# Patient Record
Sex: Female | Born: 1965 | Race: White | Hispanic: No | Marital: Married | Smoking: Current every day smoker
Health system: Southern US, Community
[De-identification: ages and names within clinical notes are randomized; demographics above are authoritative.]

## PROBLEM LIST (undated history)

## (undated) DIAGNOSIS — M898X9 Other specified disorders of bone, unspecified site: Secondary | ICD-10-CM

## (undated) DIAGNOSIS — M549 Dorsalgia, unspecified: Secondary | ICD-10-CM

## (undated) DIAGNOSIS — F39 Unspecified mood [affective] disorder: Secondary | ICD-10-CM

## (undated) DIAGNOSIS — G4730 Sleep apnea, unspecified: Secondary | ICD-10-CM

## (undated) DIAGNOSIS — E785 Hyperlipidemia, unspecified: Secondary | ICD-10-CM

## (undated) DIAGNOSIS — M2560 Stiffness of unspecified joint, not elsewhere classified: Secondary | ICD-10-CM

## (undated) DIAGNOSIS — R45851 Suicidal ideations: Secondary | ICD-10-CM

## (undated) DIAGNOSIS — F419 Anxiety disorder, unspecified: Secondary | ICD-10-CM

## (undated) DIAGNOSIS — F329 Major depressive disorder, single episode, unspecified: Secondary | ICD-10-CM

## (undated) DIAGNOSIS — K5900 Constipation, unspecified: Secondary | ICD-10-CM

## (undated) DIAGNOSIS — J449 Chronic obstructive pulmonary disease, unspecified: Secondary | ICD-10-CM

## (undated) DIAGNOSIS — K219 Gastro-esophageal reflux disease without esophagitis: Secondary | ICD-10-CM

## (undated) DIAGNOSIS — J45909 Unspecified asthma, uncomplicated: Secondary | ICD-10-CM

## (undated) DIAGNOSIS — G8929 Other chronic pain: Secondary | ICD-10-CM

## (undated) DIAGNOSIS — T8859XA Other complications of anesthesia, initial encounter: Secondary | ICD-10-CM

## (undated) DIAGNOSIS — T7840XA Allergy, unspecified, initial encounter: Secondary | ICD-10-CM

## (undated) DIAGNOSIS — F32A Depression, unspecified: Secondary | ICD-10-CM

## (undated) DIAGNOSIS — T4145XA Adverse effect of unspecified anesthetic, initial encounter: Secondary | ICD-10-CM

## (undated) DIAGNOSIS — M1990 Unspecified osteoarthritis, unspecified site: Secondary | ICD-10-CM

## (undated) DIAGNOSIS — F319 Bipolar disorder, unspecified: Secondary | ICD-10-CM

## (undated) DIAGNOSIS — G43909 Migraine, unspecified, not intractable, without status migrainosus: Secondary | ICD-10-CM

## (undated) HISTORY — DX: Depression, unspecified: F32.A

## (undated) HISTORY — DX: Migraine, unspecified, not intractable, without status migrainosus: G43.909

## (undated) HISTORY — DX: Major depressive disorder, single episode, unspecified: F32.9

## (undated) HISTORY — DX: Unspecified osteoarthritis, unspecified site: M19.90

## (undated) HISTORY — DX: Other specified disorders of bone, unspecified site: M89.8X9

## (undated) HISTORY — DX: Allergy, unspecified, initial encounter: T78.40XA

## (undated) HISTORY — DX: Other chronic pain: G89.29

## (undated) HISTORY — DX: Unspecified mood (affective) disorder: F39

## (undated) HISTORY — PX: TUBAL LIGATION: SHX77

## (undated) HISTORY — DX: Constipation, unspecified: K59.00

## (undated) HISTORY — PX: BREAST SURGERY: SHX581

## (undated) HISTORY — DX: Gastro-esophageal reflux disease without esophagitis: K21.9

## (undated) HISTORY — DX: Stiffness of unspecified joint, not elsewhere classified: M25.60

## (undated) HISTORY — DX: Unspecified asthma, uncomplicated: J45.909

## (undated) HISTORY — PX: BACK SURGERY: SHX140

## (undated) HISTORY — DX: Dorsalgia, unspecified: M54.9

## (undated) HISTORY — PX: OTHER SURGICAL HISTORY: SHX169

## (undated) HISTORY — DX: Bipolar disorder, unspecified: F31.9

## (undated) HISTORY — DX: Hyperlipidemia, unspecified: E78.5

## (undated) HISTORY — PX: TONSILLECTOMY: SUR1361

## (undated) HISTORY — DX: Suicidal ideations: R45.851

## (undated) HISTORY — PX: EYE SURGERY: SHX253

---
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
## 1992-05-20 HISTORY — PX: ABDOMINAL HYSTERECTOMY: SHX81

## 2002-02-08 ENCOUNTER — Encounter: Admission: RE | Admit: 2002-02-08 | Discharge: 2002-02-25 | Payer: Self-pay | Admitting: Unknown Physician Specialty

## 2002-03-17 ENCOUNTER — Encounter: Payer: Self-pay | Admitting: Neurosurgery

## 2002-03-22 ENCOUNTER — Ambulatory Visit (HOSPITAL_COMMUNITY): Admission: RE | Admit: 2002-03-22 | Discharge: 2002-03-22 | Payer: Self-pay | Admitting: Neurosurgery

## 2002-03-22 ENCOUNTER — Encounter: Payer: Self-pay | Admitting: Neurosurgery

## 2002-05-03 ENCOUNTER — Encounter: Admission: RE | Admit: 2002-05-03 | Discharge: 2002-06-08 | Payer: Self-pay | Admitting: Neurosurgery

## 2008-04-11 ENCOUNTER — Encounter: Admission: RE | Admit: 2008-04-11 | Discharge: 2008-04-11 | Payer: Self-pay | Admitting: Neurosurgery

## 2008-05-02 ENCOUNTER — Ambulatory Visit (HOSPITAL_COMMUNITY): Admission: RE | Admit: 2008-05-02 | Discharge: 2008-05-02 | Payer: Self-pay | Admitting: Neurosurgery

## 2008-08-29 ENCOUNTER — Encounter: Admission: RE | Admit: 2008-08-29 | Discharge: 2008-08-29 | Payer: Self-pay | Admitting: Neurosurgery

## 2010-10-02 NOTE — Op Note (Signed)
Susan Ward, HEEG NO.:  [id number]   MEDICAL RECORD NO.:  [id number]          PATIENT TYPE:  OIB   LOCATION:  3025                         FACILITY:  [facility]   PHYSICIAN:  Kathaleen Maser. Pool, M.D.    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   PROCEDURE NAME:  Right T10/T11 transpedicular microdiskectomy.   SURGEON:  Kathaleen Maser. Pool, MD   ASSISTANT:  Donalee Citrin, MD   ANESTHESIA:  General endotracheal.   INDICATIONS:  Susan Ward is a 45 year old female with history of back and  right lower extremity pain and dysfunction consistent with the lower  thoracic myelopathy.  Workup demonstrates evidence of rather large right  paracentral disk herniation with compression of thecal sac and spinal  cord and nerve roots.  The patient has been counseled as to her options.  She decided proceed with a right-sided T10/T11 transpedicular  microdiskectomy in hopes improving her symptoms.   OPERATIVE NOTE:  The patient was brought to the operating room and  placed on the operating table in a supine position.  After adequate  level of anesthesia was achieved, the patient was placed prone onto  Wilson frame, appropriately padded.  The patient's lumbar regions were  prepped and draped sterilely.  A 10-blade was used to make a curvilinear  incision overlying the T10/T11 interspace.  This was carried down  sharply in the midline.  Subperiosteal dissection was then performed  exposing the lamina and facet joints of T10 and T11.  Deep self-  retaining retractor was placed.  Intraoperative x-ray was taken.  Level  was confirmed.  Laminotomy and facetectomy was then performed on the  right-sided T10-11 using the  high-speed drill and Kerrison rongeurs.  Inferior aspect of lamina of T10, medial aspect of T10-11 facet joint,  and the superior rim of T11 lamina were removed.  Microscope was then  brought to the field.  Ligament flavum was then elevated and then the  underlying thecal sac was identified.  The superior aspect of the  pedicle was then drilled down flush to the level of the vertebral body  of T11.  Disk space was then identified.  It was then incised with 15  blade.  The disk space was then entered through the superior aspect of  the vertebral body of T11 to help make a pocket into which the disk  could be pushed into and resected.  Using this technique and working  from lateral to medial, diskectomy was then performed under microscopic  visualization completely removing the central disk herniation without  putting pressure upon the thecal sac.  All elements of disk herniation  were completely resected.  All loose fragments and degenerative disk  material were removed from the interspace.  At this point, a very  thorough decompression had been achieved.  There was no  injury to thecal  sac or nerve roots.  Wound was then irrigated with antibiotic solution.  Gelfoam was placed topically for hemostasis which was found to be good.  Microscope and retractors were removed.  Hemostasis was then achieved  with  electrocautery.  Wound was then closed in layers with Vicryl sutures.  Steri-Strips and sterile dressings were applied.  There were no apparent  complications.  The patient tolerated the procedure well and she returns  to recovery room postoperatively.           ______________________________  Kathaleen Maser Pool, M.D.     HAP/MEDQ  D:  05/02/2008  T:  05/03/2008  Job:  [id number]

## 2010-10-05 NOTE — Op Note (Signed)
NAMEAISHWARYA, Susan Ward NO.:  [id number]   MEDICAL RECORD NO.:  [id number]                   PATIENT TYPE:  OIB   LOCATION:  2895                                 FACILITY:  [facility]   PHYSICIAN:  Kathaleen Maser. Pool, M.D.                 DATE OF BIRTH:  1966-01-17   DATE OF PROCEDURE:  03/22/2002  DATE OF DISCHARGE:                                 OPERATIVE REPORT   PREOPERATIVE DIAGNOSES:  Left L4-5 herniated nucleus pulposus with  radiculopathy.   POSTOPERATIVE DIAGNOSES:  Left L4-5 herniated nucleus pulposus with  radiculopathy.   OPERATION PERFORMED:  Left L4-5 laminotomy and microdiskectomy.   SURGEON:  Kathaleen Maser. Pool, M.D.   ASSISTANT:  Reinaldo Meeker, M.D.   ANESTHESIA:  General endotracheal.   INDICATIONS FOR PROCEDURE:  The patient is a 45 year old female with a  history of back and left lower extremity pain,  paresthesias and weakness  with left-sided L5 radiculopathy which has failed conservative management.  MRI scan demonstrates evidence of significant left-sided L4-5 disk  herniation with an anterior free fragment causing compression of the left-  sided L5 nerve root.  The patient has failed conservative management.  We  discussed options for further care and she has decided to proceed with a  left-sided L4-5 laminotomy and microdiskectomy for hopeful improvement of  the symptoms.   DESCRIPTION OF PROCEDURE:  The patient was taken to the operating room and  placed on the table in the supine position.  After adequate level of  anesthesia was achieved, the patient was positioned prone onto a Wilson  frame and appropriately padded.  The patient's lumbar region was prepped and  draped sterilely.  A 10 blade was used to make a linear skin incision  overlying the L4-5 interspace.  This was carried down sharply in the  midline.  A subperiosteal dissection was then performed exposing the lamina  and facet joints of L4 and L5 on the left.  Deep  self-retaining retractor  was placed.  Intraoperative x-ray was taken, the level was confirmed.  The  laminotomy was then performed using high speed drill and Kerrison rongeurs  to remove the inferior one third of the lamina at L4, the medial one edge of  the L4-5 facet joint and superior rim of the L5 lamina.  The ligamentum  flavum was then elevated and resected in piecemeal fashion using Kerrison  rongeurs.  The underlying thecal sac and exiting L5 nerve root were  identified.  The microscope was brought into the field and used for  microdissection of the left-sided L5 nerve root and underlying disk  herniation.  Epidural venous plexus was coagulated and cut.  Thecal sac and  S1 nerve root mobilized and retracted towards the midline.  The disk space  was readily apparent as was the disk herniation.  A moderately sized  inferior free  fragment was then identified and dissected free using blunt  nerve hooks.  This was resected completely using pituitary rongeurs.  The  disk space was incised with a 15 blade in rectangular fashion.  A wide disk  space cleanout was then achieved pituitary rongeurs and upward angled  pituitary rongeurs and Epstein curets.  All loose or obviously degenerative  disk material was removed from the interspace.  All elements of the disk  herniation were completely resected.  At this point a very thorough  diskectomy had been performed.  A blunt probe was passed easily along the  course of the exiting nerve root.  There was no evidence of injury to thecal  sac or nerve roots.  The wound was then irrigated with antibiotic solution.  Gelfoam was placed topically for hemostasis which was found to be good.  Microscope and retractor system were removed.  Hemostasis in the muscle was  achieved with electrocautery.  The wound was then closed in layers with  Vicryl sutures.  Steri-Strips and sterile dressing were applied.  There were  no apparent complications.  The patient  tolerated the procedure well and  returned to the recovery room in good condition postoperatively.                                                Henry A. Pool, M.D.    HAP/MEDQ  D:  03/22/2002  T:  03/22/2002  Job:  [id number]

## 2011-02-21 LAB — BASIC METABOLIC PANEL
BUN: 9 mg/dL (ref 6–23)
CO2: 23 mEq/L (ref 19–32)
Calcium: 9.9 mg/dL (ref 8.4–10.5)
Chloride: 109 mEq/L (ref 96–112)
Creatinine, Ser: 0.58 mg/dL (ref 0.4–1.2)
GFR calc Af Amer: >60 mL/min (ref >60)
GFR calc non Af Amer: >60 mL/min (ref >60)
Glucose, Bld: 98 mg/dL (ref 70–99)
Potassium: 4.4 mEq/L (ref 3.5–5.1)
Sodium: 141 mEq/L (ref 135–145)

## 2011-02-21 LAB — CBC
HCT: 46.5 % — ABNORMAL HIGH (ref 36.0–46.0)
Hemoglobin: 15.7 g/dL — ABNORMAL HIGH (ref 12.0–15.0)
MCHC: 33.7 g/dL (ref 30.0–36.0)
MCV: 92.9 fL (ref 78.0–100.0)
Platelets: 295 10*3/uL (ref 150–400)
RBC: 5 MIL/uL (ref 3.87–5.11)
RDW: 12.7 % (ref 11.5–15.5)
WBC: 10.4 10*3/uL (ref 4.0–10.5)

## 2011-02-21 LAB — ABO/RH: ABO/RH(D): O POS

## 2011-02-21 LAB — TYPE AND SCREEN
ABO/RH(D): O POS
Antibody Screen: NEGATIVE

## 2014-03-31 ENCOUNTER — Ambulatory Visit: Payer: Medicare Other | Attending: *Deleted | Admitting: Physical Therapy

## 2014-03-31 DIAGNOSIS — R252 Cramp and spasm: Secondary | ICD-10-CM | POA: Insufficient documentation

## 2014-04-05 ENCOUNTER — Encounter: Payer: Medicare Other | Admitting: Physical Therapy

## 2014-04-07 ENCOUNTER — Encounter: Payer: Medicare Other | Admitting: Physical Therapy

## 2015-12-28 ENCOUNTER — Telehealth: Payer: Self-pay | Admitting: Physician Assistant

## 2015-12-28 NOTE — Telephone Encounter (Signed)
At this time she will need to continue with Dr. Melina Copa for her chronic medications until she can be seen through a pain clinic. She may want to contact her neurosurgeon for recommendations, they may have someone associated with their clinic for such things.  She can also leave a message with Andee Poles, my old nurse with [facility], about getting a different referral if her insurance requires such things.

## 2016-01-15 NOTE — Telephone Encounter (Signed)
Several attempts have been made to contact patient. This encounter will be closed.  

## 2016-06-17 ENCOUNTER — Encounter (INDEPENDENT_AMBULATORY_CARE_PROVIDER_SITE_OTHER): Payer: Self-pay

## 2016-06-17 ENCOUNTER — Encounter: Payer: Self-pay | Admitting: Physician Assistant

## 2016-06-17 ENCOUNTER — Ambulatory Visit (INDEPENDENT_AMBULATORY_CARE_PROVIDER_SITE_OTHER): Payer: Medicare Other | Admitting: Physician Assistant

## 2016-06-17 VITALS — BP 123/73 | HR 72 | Temp 98.4°F | Ht 63.0 in | Wt 146.4 lb

## 2016-06-17 DIAGNOSIS — F339 Major depressive disorder, recurrent, unspecified: Secondary | ICD-10-CM | POA: Diagnosis not present

## 2016-06-17 DIAGNOSIS — M5136 Other intervertebral disc degeneration, lumbar region: Secondary | ICD-10-CM | POA: Diagnosis not present

## 2016-06-17 DIAGNOSIS — G43809 Other migraine, not intractable, without status migrainosus: Secondary | ICD-10-CM

## 2016-06-17 DIAGNOSIS — K219 Gastro-esophageal reflux disease without esophagitis: Secondary | ICD-10-CM

## 2016-06-17 DIAGNOSIS — K5900 Constipation, unspecified: Secondary | ICD-10-CM | POA: Diagnosis not present

## 2016-06-17 DIAGNOSIS — E7800 Pure hypercholesterolemia, unspecified: Secondary | ICD-10-CM

## 2016-06-17 DIAGNOSIS — J309 Allergic rhinitis, unspecified: Secondary | ICD-10-CM | POA: Insufficient documentation

## 2016-06-17 DIAGNOSIS — G43909 Migraine, unspecified, not intractable, without status migrainosus: Secondary | ICD-10-CM | POA: Insufficient documentation

## 2016-06-17 MED ORDER — ATORVASTATIN CALCIUM 40 MG PO TABS: 40.0000 mg | ORAL_TABLET | Freq: Every day | ORAL | 11 refills | Status: DC

## 2016-06-17 NOTE — Progress Notes (Signed)
BP 123/73   Pulse 72   Temp 98.4 F (36.9 C) (Oral)   Ht 5\' 3"  (1.6 m)   Wt 146 lb 6.4 oz (66.4 kg)   BMI 25.93 kg/m    Subjective:    Patient ID: Susan Ward, female    DOB: [birth year], 51 y.o.   MRN: [id number]  Susan Ward is a 51 y.o. female presenting on 06/17/2016 for Establish Care (Coming from [facility])  HPI Patient here to be established as new patient at [place].  This patient is known to me from [facility]. This patient comes in for periodic recheck on medications and conditions. All medications are reviewed today. There are no reports of any problems with the medications. All of the medical conditions are reviewed and updated.  Lab work is reviewed and will be ordered as medically necessary.   [place]  Past Medical History:  Diagnosis Date  . Depression   . GERD (gastroesophageal reflux disease)   . Hyperlipidemia   . Mood disorder ([place])    Relevant past medical, surgical, family and social history reviewed and updated as indicated. Interim medical history since our last visit reviewed. Allergies and medications reviewed and updated.   Data reviewed from any sources in EPIC.  Review of Systems  Constitutional: Negative for activity change, fatigue and fever.  HENT: Negative.   Eyes: Negative.   Respiratory: Negative.  Negative for cough.   Cardiovascular: Negative.  Negative for chest pain.  Gastrointestinal: Negative.  Negative for abdominal pain.  Endocrine: Negative.   Genitourinary: Negative.  Negative for dysuria.  Musculoskeletal: Positive for arthralgias, back pain, gait problem and myalgias.  Skin: Negative.   Neurological: Positive for weakness and numbness.     Social History   Social History  . Marital status: Married    Spouse name: N/A  . Number of children: N/A  . Years of education: N/A   Occupational History  . Not on file.   Social History Main Topics  . Smoking status: Current  Every Day Smoker  . Smokeless tobacco: Never Used  . Alcohol use No  . Drug use: No  . Sexual activity: Not on file   Other Topics Concern  . Not on file   Social History Narrative  . No narrative on file    Past Surgical History:  Procedure Laterality Date  . ABDOMINAL HYSTERECTOMY  1994  . BACK SURGERY     HAs had 4 back surgery  . CESAREAN SECTION      Family History  Problem Relation Age of Onset  . Cancer Mother     Gallbladder  . Diabetes Mother   . Hypertension Mother   . COPD Father   . Cancer Father     Colon, lung, brain tumor  . Diabetes Father   . Hypertension Father   . Cancer Sister     leukemia   . Hypertension Sister   . Diabetes Sister   . COPD Sister   . Cancer Maternal Aunt     breast  . Cancer Paternal Aunt     skin   . Cancer Sister     ovarian   . Heart disease Paternal Aunt     Allergies as of 06/17/2016      Reactions   Aspirin Nausea And Vomiting   upset stomach   Oxycodone    Penicillin G Rash   Yeast Infection      Medication List  Accurate as of 06/17/16  3:33 PM. Always use your most recent med list.          aspirin EC 81 MG tablet Take 81 mg by mouth every other day.   atorvastatin 40 MG tablet Commonly known as:  LIPITOR Take 40 mg by mouth daily at 6 PM.   DULoxetine 30 MG capsule Commonly known as:  CYMBALTA Take 30 mg by mouth 2 (two) times daily.   fenofibrate micronized 134 MG capsule Commonly known as:  LOFIBRA Take 134 mg by mouth daily before breakfast.   fluticasone 50 MCG/ACT nasal spray Commonly known as:  FLONASE INSTILL 1 SPRAY IN EACH NOSTRIL TWICE A DAY NASALLY 30 DAY(S)   HYDROcodone-acetaminophen 10-325 MG tablet Commonly known as:  NORCO Take 1 tablet by mouth 3 (three) times daily as needed.   linaclotide 145 MCG Caps capsule Commonly known as:  LINZESS Take 145 mcg by mouth daily before breakfast.   omeprazole 20 MG capsule Commonly known as:  PRILOSEC Take 20 mg by mouth  daily.   topiramate 100 MG tablet Commonly known as:  TOPAMAX Take 100 mg by mouth 2 (two) times daily.          Objective:    BP 123/73   Pulse 72   Temp 98.4 F (36.9 C) (Oral)   Ht 5\' 3"  (1.6 m)   Wt 146 lb 6.4 oz (66.4 kg)   BMI 25.93 kg/m   Allergies  Allergen Reactions  . Aspirin Nausea And Vomiting    upset stomach  . Oxycodone   . Penicillin G Rash    Yeast Infection   Wt Readings from Last 3 Encounters:  06/17/16 146 lb 6.4 oz (66.4 kg)    Physical Exam  Constitutional: She is oriented to person, place, and time. She appears well-developed and well-nourished.  HENT:  Head: Normocephalic and atraumatic.  Right Ear: Tympanic membrane, external ear and ear canal normal.  Left Ear: Tympanic membrane, external ear and ear canal normal.  Nose: Nose normal. No rhinorrhea.  Mouth/Throat: Oropharynx is clear and moist and mucous membranes are normal. No oropharyngeal exudate or posterior oropharyngeal erythema.  Eyes: Conjunctivae and EOM are normal. Pupils are equal, round, and reactive to light.  Neck: Normal range of motion. Neck supple.  Cardiovascular: Normal rate, regular rhythm, normal heart sounds and intact distal pulses.   Pulmonary/Chest: Effort normal and breath sounds normal.  Abdominal: Soft. Bowel sounds are normal.  Neurological: She is alert and oriented to person, place, and time. She has normal reflexes. No cranial nerve deficit.  Skin: Skin is warm and dry. No rash noted.  Psychiatric: She has a normal mood and affect. Her behavior is normal. Judgment and thought content normal.        Assessment & Plan:   1. Gastroesophageal reflux disease without esophagitis - omeprazole (PRILOSEC) 20 MG capsule; Take 20 mg by mouth daily.   2. Depression, recurrent (HCC) - DULoxetine (CYMBALTA) 30 MG capsule; Take 30 mg by mouth 2 (two) times daily.   3. Pure hypercholesterolemia - atorvastatin (LIPITOR) 40 MG tablet; Take 40 mg by mouth daily at 6  PM.  - fenofibrate micronized (LOFIBRA) 134 MG capsule; Take 134 mg by mouth daily before breakfast.  - aspirin EC 81 MG tablet; Take 81 mg by mouth every other day.  4. DDD (degenerative disc disease), lumbar - HYDROcodone-acetaminophen (NORCO) 10-325 MG tablet; Take 1 tablet by mouth 3 (three) times daily as needed.   5. Allergic  rhinitis, unspecified chronicity, unspecified seasonality, unspecified trigger - fluticasone (FLONASE) 50 MCG/ACT nasal spray; INSTILL 1 SPRAY IN EACH NOSTRIL TWICE A DAY NASALLY 30 DAY(S)  6. Other migraine without status migrainosus, not intractable - topiramate (TOPAMAX) 100 MG tablet; Take 100 mg by mouth 2 (two) times daily.   7. Constipation, unspecified constipation type - linaclotide (LINZESS) 145 MCG CAPS capsule; Take 145 mcg by mouth daily before breakfast.    Continue all other maintenance medications as listed above. Educational handout given for DDD  Follow up plan: No Follow-up on file.  Terald Sleeper PA-C [facility] [address]  [place], [place] [address] [phone]   06/17/2016, 3:33 PM

## 2016-06-17 NOTE — Patient Instructions (Signed)
Degenerative Disk Disease Introduction Degenerative disk disease is a condition caused by the changes that occur in spinal disks as you grow older. Spinal disks are soft and compressible disks located between the bones of your spine (vertebrae). These disks act like shock absorbers. Degenerative disk disease can affect the whole spine. However, the neck and lower back are most commonly affected. Many changes can occur in the spinal disks with aging, such as:  The spinal disks may dry and shrink.  Small tears may occur in the tough, outer covering of the disk (annulus).  The disk space may become smaller due to loss of water.  Abnormal growths in the bone (spurs) may occur. This can put pressure on the nerve roots exiting the spinal canal, causing pain.  The spinal canal may become narrowed. What increases the risk?  Being overweight.  Having a family history of degenerative disk disease.  Smoking.  There is increased risk if you are doing heavy lifting or have a sudden injury. What are the signs or symptoms? Symptoms vary from person to person and may include:  Pain that varies in intensity. Some people have no pain, while others have severe pain. The location of the pain depends on the part of your backbone that is affected.  You will have neck or arm pain if a disk in the neck area is affected.  You will have pain in your back, buttocks, or legs if a disk in the lower back is affected.  Pain that becomes worse while bending, reaching up, or with twisting movements.  Pain that may start gradually and then get worse as time passes. It may also start after a major or minor injury.  Numbness or tingling in the arms or legs. How is this diagnosed? Your health care provider will ask you about your symptoms and about activities or habits that may cause the pain. He or she may also ask about any injuries, diseases, or treatments you have had. Your health care provider will examine you  to check for the range of movement that is possible in the affected area, to check for strength in your extremities, and to check for sensation in the areas of the arms and legs supplied by different nerve roots. You may also have:  An X-ray of the spine.  Other imaging tests, such as MRI. How is this treated? Your health care provider will advise you on the best plan for treatment. Treatment may include:  Medicines.  Rehabilitation exercises. Follow these instructions at home:  Follow proper lifting and walking techniques as advised by your health care provider.  Maintain good posture.  Exercise regularly as advised by your health care provider.  Perform relaxation exercises.  Change your sitting, standing, and sleeping habits as advised by your health care provider.  Change positions frequently.  Lose weight or maintain a healthy weight as advised by your health care provider.  Do not use any tobacco products, including cigarettes, chewing tobacco, or electronic cigarettes. If you need help quitting, ask your health care provider.  Wear supportive footwear.  Take medicines only as directed by your health care provider. Contact a health care provider if:  Your pain does not go away within 1-4 weeks.  You have significant appetite or weight loss. Get help right away if:  Your pain is severe.  You notice weakness in your arms, hands, or legs.  You begin to lose control of your bladder or bowel movements.  You have fevers or night  sweats. This information is not intended to replace advice given to you by your health care provider. Make sure you discuss any questions you have with your health care provider. Document Released: 03/03/2007 Document Revised: 10/12/2015 Document Reviewed: 09/07/2013  2017 Elsevier

## 2016-07-16 ENCOUNTER — Encounter: Payer: Self-pay | Admitting: Family

## 2016-07-16 ENCOUNTER — Ambulatory Visit (INDEPENDENT_AMBULATORY_CARE_PROVIDER_SITE_OTHER): Payer: Medicare Other

## 2016-07-16 ENCOUNTER — Ambulatory Visit (INDEPENDENT_AMBULATORY_CARE_PROVIDER_SITE_OTHER): Payer: Medicare Other | Admitting: Family

## 2016-07-16 VITALS — BP 111/74 | HR 88 | Temp 97.6°F | Ht 63.0 in | Wt 146.2 lb

## 2016-07-16 DIAGNOSIS — R6889 Other general symptoms and signs: Secondary | ICD-10-CM | POA: Diagnosis not present

## 2016-07-16 DIAGNOSIS — R05 Cough: Secondary | ICD-10-CM

## 2016-07-16 DIAGNOSIS — J209 Acute bronchitis, unspecified: Secondary | ICD-10-CM | POA: Diagnosis not present

## 2016-07-16 DIAGNOSIS — R059 Cough, unspecified: Secondary | ICD-10-CM

## 2016-07-16 LAB — VERITOR FLU A/B WAIVED
Influenza A: NEGATIVE
Influenza B: NEGATIVE

## 2016-07-16 MED ORDER — DOXYCYCLINE HYCLATE 100 MG PO TABS: 100.0000 mg | ORAL_TABLET | Freq: Two times a day (BID) | ORAL | 0 refills | Status: DC

## 2016-07-16 MED ORDER — PREDNISONE 10 MG (21) PO TBPK: ORAL_TABLET | ORAL | 0 refills | Status: DC

## 2016-07-16 NOTE — Patient Instructions (Signed)

## 2016-07-16 NOTE — Progress Notes (Signed)
Subjective:    Patient ID: Susan Ward, female    DOB: Oct 09, 1965, 51 y.o.   MRN: [id number]  Cough  This is a new problem. The current episode started in the past 7 days. The problem has been gradually worsening. The problem occurs every few minutes. The cough is non-productive. Associated symptoms include chills, a fever, headaches, myalgias, nasal congestion, postnasal drip, rhinorrhea, a sore throat, shortness of breath and wheezing. Pertinent negatives include no ear congestion or ear pain. The symptoms are aggravated by lying down. Risk factors for lung disease include smoking/tobacco exposure. She has tried rest and OTC cough suppressant for the symptoms. Her past medical history is significant for asthma.      Review of Systems  Constitutional: Positive for chills and fever.  HENT: Positive for postnasal drip, rhinorrhea and sore throat. Negative for ear pain.   Respiratory: Positive for cough, shortness of breath and wheezing.   Musculoskeletal: Positive for myalgias.  Neurological: Positive for headaches.  All other systems reviewed and are negative.      Objective:   Physical Exam  Constitutional: She is oriented to person, place, and time. She appears well-developed and well-nourished. She has a sickly appearance. She appears ill. No distress.  HENT:  Head: Normocephalic and atraumatic.  Right Ear: External ear normal.  Left Ear: External ear normal.  Nose: Mucosal edema and rhinorrhea present.  Mouth/Throat: Posterior oropharyngeal erythema present.  Eyes: Pupils are equal, round, and reactive to light.  Neck: Normal range of motion. Neck supple. No thyromegaly present.  Cardiovascular: Normal rate, regular rhythm, normal heart sounds and intact distal pulses.   No murmur heard. Pulmonary/Chest: Effort normal and breath sounds normal. No respiratory distress. She has no wheezes.  Intermittent coarse nonproductive cough   Abdominal: Soft. Bowel sounds are normal.  She exhibits no distension. There is no tenderness.  Musculoskeletal: Normal range of motion. She exhibits no edema or tenderness.  Neurological: She is alert and oriented to person, place, and time.  Skin: Skin is warm and dry.  Psychiatric: She has a normal mood and affect. Her behavior is normal. Judgment and thought content normal.  Vitals reviewed.    BP 111/74   Pulse 88   Temp 97.6 F (36.4 C) (Oral)   Ht 5\' 3"  (1.6 m)   Wt 146 lb 3.2 oz (66.3 kg)   SpO2 95%   BMI 25.90 kg/m      Assessment & Plan:  1. Flu-like symptoms - Veritor Flu A/B Waived - DG Chest 2 View; Future  2. Cough - DG Chest 2 View; Future  3. Acute bronchitis, unspecified organism - Take meds as prescribed - Use a cool mist humidifier  -Use saline nose sprays frequently -Saline irrigations of the nose can be very helpful if done frequently.  * 4X daily for 1 week*  * Use of a nettie pot can be helpful with this. Follow directions with this* -Force fluids -For any cough or congestion  Use plain Mucinex- regular strength or max strength is fine   * Children- consult with Pharmacist for dosing -For fever or aces or pains- take tylenol or ibuprofen appropriate for age and weight.  * for fevers greater than 101 orally you may alternate ibuprofen and tylenol every  3 hours. -Throat lozenges if help - doxycycline (VIBRA-TABS) 100 MG tablet; Take 1 tablet (100 mg total) by mouth 2 (two) times daily.  Dispense: 20 tablet; Refill: 0 - predniSONE (STERAPRED UNI-PAK 21 TAB) 10 MG (  21) TBPK tablet; Use as directed  Dispense: 21 tablet; Refill: 0   Evelina Dun, FNP

## 2016-07-18 ENCOUNTER — Other Ambulatory Visit: Payer: Self-pay | Admitting: Family

## 2016-07-18 DIAGNOSIS — J449 Chronic obstructive pulmonary disease, unspecified: Secondary | ICD-10-CM | POA: Insufficient documentation

## 2016-08-25 DIAGNOSIS — R0902 Hypoxemia: Secondary | ICD-10-CM | POA: Diagnosis not present

## 2016-08-28 ENCOUNTER — Ambulatory Visit (INDEPENDENT_AMBULATORY_CARE_PROVIDER_SITE_OTHER): Payer: Medicare Other | Admitting: Physician Assistant

## 2016-08-28 ENCOUNTER — Encounter: Payer: Self-pay | Admitting: Physician Assistant

## 2016-08-28 VITALS — BP 128/67 | HR 63 | Temp 97.5°F | Ht 63.0 in | Wt 148.8 lb

## 2016-08-28 DIAGNOSIS — Z0000 Encounter for general adult medical examination without abnormal findings: Secondary | ICD-10-CM | POA: Diagnosis not present

## 2016-08-28 DIAGNOSIS — E7800 Pure hypercholesterolemia, unspecified: Secondary | ICD-10-CM

## 2016-08-28 DIAGNOSIS — L308 Other specified dermatitis: Secondary | ICD-10-CM | POA: Insufficient documentation

## 2016-08-28 DIAGNOSIS — M5136 Other intervertebral disc degeneration, lumbar region: Secondary | ICD-10-CM

## 2016-08-28 DIAGNOSIS — J449 Chronic obstructive pulmonary disease, unspecified: Secondary | ICD-10-CM

## 2016-08-28 DIAGNOSIS — R202 Paresthesia of skin: Secondary | ICD-10-CM | POA: Diagnosis not present

## 2016-08-28 DIAGNOSIS — L299 Pruritus, unspecified: Secondary | ICD-10-CM | POA: Insufficient documentation

## 2016-08-28 DIAGNOSIS — K219 Gastro-esophageal reflux disease without esophagitis: Secondary | ICD-10-CM | POA: Diagnosis not present

## 2016-08-28 MED ORDER — DOXEPIN HCL 5 % EX CREA: 1.0000 "application " | TOPICAL_CREAM | Freq: Three times a day (TID) | CUTANEOUS | 2 refills | Status: DC | PRN

## 2016-08-28 MED ORDER — LIDOCAINE 5 % EX OINT: 1.0000 "application " | TOPICAL_OINTMENT | Freq: Three times a day (TID) | CUTANEOUS | 2 refills | Status: DC | PRN

## 2016-08-28 MED ORDER — HYDROCODONE-ACETAMINOPHEN 10-325 MG PO TABS: 1.0000 | ORAL_TABLET | Freq: Four times a day (QID) | ORAL | 0 refills | Status: DC | PRN

## 2016-08-28 NOTE — Patient Instructions (Signed)
Vitamin B12 Deficiency Vitamin B12 deficiency means that your body is not getting enough vitamin B12. Your body needs vitamin B12 for important bodily functions. If you do not have enough vitamin B12 in your body, you can have health problems. Follow these instructions at home:  Take supplements only as told by your doctor. Follow the directions carefully.  Get any shots (injections) as told by your doctor. Do not miss your visits to the doctor.  Eat lots of healthy foods that contain vitamin B12. Ask your doctor if you should work with someone who is trained in how food affects health (dietitian). Foods that contain vitamin B12 include:  Meat.  Meat from birds (poultry).  Fish.  Eggs.  Cereal and dairy products that are fortified. This means that vitamin B12 has been added to the food. Check the label on the package to see if the food is fortified.  Do not drink too much (do not abuse) alcohol.  Keep all follow-up visits as told by your doctor. This is important. Contact a doctor if:  Your symptoms come back. Get help right away if:  You have trouble breathing.  You have chest pain.  You get dizzy.  You pass out (lose consciousness). This information is not intended to replace advice given to you by your health care provider. Make sure you discuss any questions you have with your health care provider. Document Released: 04/25/2011 Document Revised: 10/12/2015 Document Reviewed: 09/21/2014 Elsevier Interactive Patient Education  2017 [employer].

## 2016-08-28 NOTE — Progress Notes (Signed)
BP 128/67   Pulse 63   Temp 97.5 F (36.4 C) (Oral)   Ht 5' 3" (1.6 m)   Wt 148 lb 12.8 oz (67.5 kg)   BMI 26.36 kg/m    Subjective:    Patient ID: Susan Ward, female    DOB: [birth year], 51 y.o.   MRN: [id number]  HPI: Susan Ward is a 51 y.o. female presenting on 08/28/2016 for Headache; Medication Refill; and tingling in hands and feet  This patient comes in for periodic recheck on medications and conditions including degenerative disc disease and chronic back pain, depression, COPD, GERD, hyperlipidemia, chronic pruritus, neuropathy in the hands and feet. He has no known vitamin B12 deficiency. The hands will sometimes blanch and feel very cold. She has never had a diagnosis of Raynaud's phenomenon. With her chronic GERD problems we will need to check her vitamin B12 status. She has had some difficulty going to Dr. Dolores Lory office for pain control. She fills judged about taking her medications, she has to sit for an hour and a half sometimes for the appointment. She would like to work on reducing her medications in coming off of the pain medicine altogether. I am in agreemant with her and we will work on lowering her medication over the next 2-3 months.  All medications are reviewed today. There are no reports of any problems with the medications. All of the medical conditions are reviewed and updated.  Lab work is reviewed and will be ordered as medically necessary. There are no new problems reported with today's visit.   Relevant past medical, surgical, family and social history reviewed and updated as indicated. Allergies and medications reviewed and updated.  Past Medical History:  Diagnosis Date  . Depression   . GERD (gastroesophageal reflux disease)   . Hyperlipidemia   . Mood disorder [facility])     Past Surgical History:  Procedure Laterality Date  . ABDOMINAL HYSTERECTOMY  1994  . BACK SURGERY     HAs had 4 back surgery  . CESAREAN SECTION      Review of Systems    Constitutional: Positive for fatigue. Negative for activity change and fever.  HENT: Negative.   Eyes: Negative.   Respiratory: Negative.  Negative for cough.   Cardiovascular: Negative.  Negative for chest pain.  Gastrointestinal: Negative.  Negative for abdominal pain.  Endocrine: Negative.   Genitourinary: Negative.  Negative for dysuria.  Musculoskeletal: Positive for arthralgias, back pain and gait problem.  Skin: Positive for color change.  Neurological: Positive for numbness. Negative for tremors, seizures, syncope, speech difficulty and weakness.    Allergies as of 08/28/2016      Reactions   Aspirin Nausea And Vomiting   upset stomach   Oxycodone    Penicillin G Rash   Yeast Infection      Medication List       Accurate as of 08/28/16 11:12 AM. Always use your most recent med list.          aspirin EC 81 MG tablet Take 81 mg by mouth every other day.   atorvastatin 40 MG tablet Commonly known as:  LIPITOR Take 1 tablet (40 mg total) by mouth daily at 6 PM.   Doxepin HCl 5 % Crea Apply 1 application topically 3 (three) times daily as needed (pain and itching).   DULoxetine 30 MG capsule Commonly known as:  CYMBALTA Take 30 mg by mouth 2 (two) times daily.   fluticasone 50 MCG/ACT nasal spray  Commonly known as:  FLONASE INSTILL 1 SPRAY IN EACH NOSTRIL TWICE A DAY NASALLY 30 DAY(S)   HYDROcodone-acetaminophen 10-325 MG tablet Commonly known as:  NORCO Take 1 tablet by mouth every 6 (six) hours as needed.   hydrOXYzine 25 MG tablet Commonly known as:  ATARAX/VISTARIL   lidocaine 5 % ointment Commonly known as:  XYLOCAINE Apply 1 application topically 3 (three) times daily as needed.   linaclotide 145 MCG Caps capsule Commonly known as:  LINZESS Take 145 mcg by mouth daily before breakfast.   omeprazole 20 MG capsule Commonly known as:  PRILOSEC Take 20 mg by mouth daily.   topiramate 100 MG tablet Commonly known as:  TOPAMAX Take 100 mg by  mouth 2 (two) times daily.          Objective:    BP 128/67   Pulse 63   Temp 97.5 F (36.4 C) (Oral)   Ht 5' 3" (1.6 m)   Wt 148 lb 12.8 oz (67.5 kg)   BMI 26.36 kg/m   Allergies  Allergen Reactions  . Aspirin Nausea And Vomiting    upset stomach  . Oxycodone   . Penicillin G Rash    Yeast Infection    Physical Exam  Constitutional: She is oriented to person, place, and time. She appears well-developed and well-nourished.  HENT:  Head: Normocephalic and atraumatic.  Right Ear: Tympanic membrane, external ear and ear canal normal.  Left Ear: Tympanic membrane, external ear and ear canal normal.  Nose: Nose normal. No rhinorrhea.  Mouth/Throat: Oropharynx is clear and moist and mucous membranes are normal. No oropharyngeal exudate or posterior oropharyngeal erythema.  Eyes: Conjunctivae and EOM are normal. Pupils are equal, round, and reactive to light.  Neck: Normal range of motion. Neck supple.  Cardiovascular: Normal rate, regular rhythm, normal heart sounds and intact distal pulses.   Pulmonary/Chest: Effort normal and breath sounds normal.  Abdominal: Soft. Bowel sounds are normal.  Neurological: She is alert and oriented to person, place, and time. She has normal reflexes.  Skin: Skin is warm and dry. No rash noted.  Psychiatric: She has a normal mood and affect. Her behavior is normal. Judgment and thought content normal.  Nursing note and vitals reviewed.       Assessment & Plan:   1. Tingling in extremities - CBC with Differential/Platelet - Vitamin B12  2. Chronic obstructive pulmonary disease, unspecified COPD type ([place])  3. Gastroesophageal reflux disease without esophagitis - CBC with Differential/Platelet - CMP14+EGFR  4. DDD (degenerative disc disease), lumbar - HYDROcodone-acetaminophen (NORCO) 10-325 MG tablet; Take 1 tablet by mouth every 6 (six) hours as needed.  Dispense: 120 tablet; Refill: 0 - lidocaine (XYLOCAINE) 5 % ointment; Apply 1  application topically 3 (three) times daily as needed.  Dispense: 2500 g; Refill: 2 - Doxepin HCl 5 % CREA; Apply 1 application topically 3 (three) times daily as needed (pain and itching).  Dispense: 180 g; Refill: 2  5. Pure hypercholesterolemia - Lipid panel  6. Well adult exam Not performed, only present for lab order - CBC with Differential/Platelet - CMP14+EGFR - Lipid panel - TSH  7. Pruritic dermatitis - Doxepin HCl 5 % CREA; Apply 1 application topically 3 (three) times daily as needed (pain and itching).  Dispense: 180 g; Refill: 2   Continue all other maintenance medications as listed above.  Follow up plan: Return in about 4 weeks (around 09/25/2016) for recheck meds.  Educational handout given for Vitamin B12 deficiency  [employer]  Adah Salvage PA-C [facility] [address]  [place], [place] [address] [phone]   08/28/2016, 11:12 AM

## 2016-08-29 LAB — CBC WITH DIFFERENTIAL/PLATELET
Basophils Absolute: 0 10*3/uL (ref 0.0–0.2)
Basos: 0 %
EOS (ABSOLUTE): 0.4 10*3/uL (ref 0.0–0.4)
Eos: 4 %
Hematocrit: 42.8 % (ref 34.0–46.6)
Hemoglobin: 14.5 g/dL (ref 11.1–15.9)
Immature Grans (Abs): 0 10*3/uL (ref 0.0–0.1)
Immature Granulocytes: 0 %
Lymphocytes Absolute: 3.4 10*3/uL — ABNORMAL HIGH (ref 0.7–3.1)
Lymphs: 33 %
MCH: 31.5 pg (ref 26.6–33.0)
MCHC: 33.9 g/dL (ref 31.5–35.7)
MCV: 93 fL (ref 79–97)
Monocytes Absolute: 0.7 10*3/uL (ref 0.1–0.9)
Monocytes: 7 %
Neutrophils Absolute: 5.8 10*3/uL (ref 1.4–7.0)
Neutrophils: 56 %
Platelets: 228 10*3/uL (ref 150–379)
RBC: 4.6 x10E6/uL (ref 3.77–5.28)
RDW: 13.2 % (ref 12.3–15.4)
WBC: 10.3 10*3/uL (ref 3.4–10.8)

## 2016-08-29 LAB — LIPID PANEL
Chol/HDL Ratio: 4.2 ratio (ref 0.0–4.4)
Cholesterol, Total: 159 mg/dL (ref 100–199)
HDL: 38 mg/dL — ABNORMAL LOW (ref >39)
LDL Calculated: 75 mg/dL (ref 0–99)
Triglycerides: 228 mg/dL — ABNORMAL HIGH (ref 0–149)
VLDL Cholesterol Cal: 46 mg/dL — ABNORMAL HIGH (ref 5–40)

## 2016-08-29 LAB — CMP14+EGFR
ALT: 14 IU/L (ref 0–32)
AST: 17 IU/L (ref 0–40)
Albumin/Globulin Ratio: 1.8 (ref 1.2–2.2)
Albumin: 4.5 g/dL (ref 3.5–5.5)
Alkaline Phosphatase: 65 IU/L (ref 39–117)
BUN/Creatinine Ratio: 20 (ref 9–23)
BUN: 14 mg/dL (ref 6–24)
Bilirubin Total: 0.2 mg/dL (ref 0.0–1.2)
CO2: 22 mmol/L (ref 18–29)
Calcium: 9.8 mg/dL (ref 8.7–10.2)
Chloride: 105 mmol/L (ref 96–106)
Creatinine, Ser: 0.69 mg/dL (ref 0.57–1.00)
GFR calc Af Amer: 116 mL/min/{1.73_m2} (ref >59)
GFR calc non Af Amer: 100 mL/min/{1.73_m2} (ref >59)
Globulin, Total: 2.5 g/dL (ref 1.5–4.5)
Glucose: 90 mg/dL (ref 65–99)
Potassium: 4.2 mmol/L (ref 3.5–5.2)
Sodium: 143 mmol/L (ref 134–144)
Total Protein: 7 g/dL (ref 6.0–8.5)

## 2016-08-29 LAB — TSH: TSH: 1.3 u[IU]/mL (ref 0.450–4.500)

## 2016-08-29 LAB — VITAMIN B12: Vitamin B-12: 329 pg/mL (ref 232–1245)

## 2016-09-16 ENCOUNTER — Ambulatory Visit (INDEPENDENT_AMBULATORY_CARE_PROVIDER_SITE_OTHER): Payer: Medicare Other | Admitting: Physician Assistant

## 2016-09-16 ENCOUNTER — Encounter: Payer: Self-pay | Admitting: Physician Assistant

## 2016-09-16 ENCOUNTER — Ambulatory Visit: Payer: Medicare Other | Admitting: Physician Assistant

## 2016-09-16 DIAGNOSIS — E7800 Pure hypercholesterolemia, unspecified: Secondary | ICD-10-CM | POA: Diagnosis not present

## 2016-09-16 DIAGNOSIS — G43809 Other migraine, not intractable, without status migrainosus: Secondary | ICD-10-CM

## 2016-09-16 DIAGNOSIS — M5136 Other intervertebral disc degeneration, lumbar region: Secondary | ICD-10-CM | POA: Diagnosis not present

## 2016-09-16 DIAGNOSIS — K219 Gastro-esophageal reflux disease without esophagitis: Secondary | ICD-10-CM

## 2016-09-16 DIAGNOSIS — F339 Major depressive disorder, recurrent, unspecified: Secondary | ICD-10-CM

## 2016-09-16 MED ORDER — DULOXETINE HCL 30 MG PO CPEP: 60.0000 mg | ORAL_CAPSULE | Freq: Two times a day (BID) | ORAL | 11 refills | Status: DC

## 2016-09-16 MED ORDER — HYDROCODONE-ACETAMINOPHEN 10-325 MG PO TABS: 1.0000 | ORAL_TABLET | Freq: Four times a day (QID) | ORAL | 0 refills | Status: DC | PRN

## 2016-09-16 MED ORDER — ATORVASTATIN CALCIUM 40 MG PO TABS: 40.0000 mg | ORAL_TABLET | Freq: Every day | ORAL | 11 refills | Status: DC

## 2016-09-16 MED ORDER — FLUTICASONE PROPIONATE 50 MCG/ACT NA SUSP: 1.0000 | Freq: Two times a day (BID) | NASAL | 11 refills | Status: DC

## 2016-09-16 MED ORDER — OMEPRAZOLE 20 MG PO CPDR: 20.0000 mg | DELAYED_RELEASE_CAPSULE | Freq: Every day | ORAL | 11 refills | Status: DC

## 2016-09-16 MED ORDER — LIDOCAINE 5 % EX OINT: 1.0000 "application " | TOPICAL_OINTMENT | Freq: Three times a day (TID) | CUTANEOUS | 2 refills | Status: DC | PRN

## 2016-09-16 MED ORDER — TOPIRAMATE 100 MG PO TABS: 100.0000 mg | ORAL_TABLET | Freq: Two times a day (BID) | ORAL | 11 refills | Status: DC

## 2016-09-16 NOTE — Progress Notes (Signed)
BP 123/73   Pulse 66   Temp 97.7 F (36.5 C) (Oral)   Ht 5\' 3"  (1.6 m)   Wt 149 lb (67.6 kg)   BMI 26.39 kg/m    Subjective:    Patient ID: Susan Ward, female    DOB: 1966-03-24, 51 y.o.   MRN: [id number]  HPI: Susan Ward is a 51 y.o. female presenting on 09/16/2016 for Follow-up (3 month ) and Medication Refill  This patient comes in for periodic recheck on medications and conditions including Chronic pain syndrome due to degenerative disc disease, depression, hypertension, hyperlipidemia, migraine. The patient comes in for recheck on her pain medication. She has been doing very well with the current dosing. Pain assessment: Cause of pain- DDD Pain location- lumbar and leg Pain on scale of 1-10- 7 Frequency-daily What increases pain-walking long distance What makes pain Better-rest, meds Effects on ADL - moderate Any change in general medical condition-none  Current medications- hydrocodone APAP 10/325 1 QID Effectiveness of current meds-good Adverse reactions form pain meds-none  Pill count performed-No Urine drug screen- No Was the [place] reviewed- yes  If yes were their any concerning findings? - no .   All medications are reviewed today. There are no reports of any problems with the medications. All of the medical conditions are reviewed and updated.  Lab work is reviewed and will be ordered as medically necessary. There are no new problems reported with today's visit.   Past Medical History:  Diagnosis Date  . Depression   . GERD (gastroesophageal reflux disease)   . Hyperlipidemia   . Mood disorder ([place])    Relevant past medical, surgical, family and social history reviewed and updated as indicated. Interim medical history since our last visit reviewed. Allergies and medications reviewed and updated. DATA REVIEWED: CHART IN EPIC  Social History   Social History  . Marital status: Married    Spouse name: N/A  . Number of children: N/A  . Years of  education: N/A   Occupational History  . Not on file.   Social History Main Topics  . Smoking status: Current Every Day Smoker  . Smokeless tobacco: Never Used  . Alcohol use No  . Drug use: No  . Sexual activity: Not on file   Other Topics Concern  . Not on file   Social History Narrative  . No narrative on file    Past Surgical History:  Procedure Laterality Date  . ABDOMINAL HYSTERECTOMY  1994  . BACK SURGERY     HAs had 4 back surgery  . CESAREAN SECTION      Family History  Problem Relation Age of Onset  . Cancer Mother     Gallbladder  . Diabetes Mother   . Hypertension Mother   . COPD Father   . Cancer Father     Colon, lung, brain tumor  . Diabetes Father   . Hypertension Father   . Cancer Sister     leukemia   . Hypertension Sister   . Diabetes Sister   . COPD Sister   . Cancer Maternal Aunt     breast  . Cancer Paternal Aunt     skin   . Cancer Sister     ovarian   . Heart disease Paternal Aunt     Review of Systems  Constitutional: Negative.  Negative for activity change, fatigue and fever.  HENT: Negative.   Eyes: Negative.   Respiratory: Negative.  Negative for cough.  Cardiovascular: Negative.  Negative for chest pain.  Gastrointestinal: Negative.  Negative for abdominal pain.  Endocrine: Negative.   Genitourinary: Negative.  Negative for dysuria.  Musculoskeletal: Positive for arthralgias, back pain, gait problem and myalgias.  Skin: Negative.   Neurological: Positive for numbness.    Allergies as of 09/16/2016      Reactions   Aspirin Nausea And Vomiting   upset stomach   Oxycodone    Penicillin G Rash   Yeast Infection      Medication List       Accurate as of 09/16/16 12:48 PM. Always use your most recent med list.          aspirin EC 81 MG tablet Take 81 mg by mouth every other day.   atorvastatin 40 MG tablet Commonly known as:  LIPITOR Take 1 tablet (40 mg total) by mouth daily at 6 PM.   Doxepin HCl 5 %  Crea Apply 1 application topically 3 (three) times daily as needed (pain and itching).   DULoxetine 30 MG capsule Commonly known as:  CYMBALTA Take 2 capsules (60 mg total) by mouth 2 (two) times daily.   fluticasone 50 MCG/ACT nasal spray Commonly known as:  FLONASE Place 1 spray into both nostrils 2 (two) times daily.   HYDROcodone-acetaminophen 10-325 MG tablet Commonly known as:  NORCO Take 1 tablet by mouth every 6 (six) hours as needed.   HYDROcodone-acetaminophen 10-325 MG tablet Commonly known as:  NORCO Take 1 tablet by mouth every 6 (six) hours as needed.   HYDROcodone-acetaminophen 10-325 MG tablet Commonly known as:  NORCO Take 1 tablet by mouth every 6 (six) hours as needed.   lidocaine 5 % ointment Commonly known as:  XYLOCAINE Apply 1 application topically 3 (three) times daily as needed.   linaclotide 145 MCG Caps capsule Commonly known as:  LINZESS Take 145 mcg by mouth daily before breakfast.   omeprazole 20 MG capsule Commonly known as:  PRILOSEC Take 1 capsule (20 mg total) by mouth daily.   topiramate 100 MG tablet Commonly known as:  TOPAMAX Take 1 tablet (100 mg total) by mouth 2 (two) times daily.   vitamin C 100 MG tablet Take 100 mg by mouth daily.   vitamin E 100 UNIT capsule Take by mouth daily.          Objective:    BP 123/73   Pulse 66   Temp 97.7 F (36.5 C) (Oral)   Ht 5\' 3"  (1.6 m)   Wt 149 lb (67.6 kg)   BMI 26.39 kg/m   Allergies  Allergen Reactions  . Aspirin Nausea And Vomiting    upset stomach  . Oxycodone   . Penicillin G Rash    Yeast Infection    Wt Readings from Last 3 Encounters:  09/16/16 149 lb (67.6 kg)  08/28/16 148 lb 12.8 oz (67.5 kg)  07/16/16 146 lb 3.2 oz (66.3 kg)    Physical Exam  Constitutional: She is oriented to person, place, and time. She appears well-developed and well-nourished.  HENT:  Head: Normocephalic and atraumatic.  Eyes: Conjunctivae and EOM are normal. Pupils are equal,  round, and reactive to light.  Cardiovascular: Normal rate, regular rhythm, normal heart sounds and intact distal pulses.   Pulmonary/Chest: Effort normal and breath sounds normal.  Abdominal: Soft. Bowel sounds are normal.  Musculoskeletal:       Lumbar back: She exhibits decreased range of motion, tenderness, pain and spasm.  Neurological: She is alert and oriented  to person, place, and time. She has normal reflexes.  Skin: Skin is warm and dry. No rash noted.  Psychiatric: She has a normal mood and affect. Her behavior is normal. Judgment and thought content normal.  Nursing note and vitals reviewed.       Assessment & Plan:   1. DDD (degenerative disc disease), lumbar - HYDROcodone-acetaminophen (NORCO) 10-325 MG tablet; Take 1 tablet by mouth every 6 (six) hours as needed.  Dispense: 120 tablet; Refill: 0 - lidocaine (XYLOCAINE) 5 % ointment; Apply 1 application topically 3 (three) times daily as needed.  Dispense: 2500 g; Refill: 2 - HYDROcodone-acetaminophen (NORCO) 10-325 MG tablet; Take 1 tablet by mouth every 6 (six) hours as needed.  Dispense: 120 tablet; Refill: 0 - HYDROcodone-acetaminophen (NORCO) 10-325 MG tablet; Take 1 tablet by mouth every 6 (six) hours as needed.  Dispense: 120 tablet; Refill: 0  2. Gastroesophageal reflux disease without esophagitis - omeprazole (PRILOSEC) 20 MG capsule; Take 1 capsule (20 mg total) by mouth daily.  Dispense: 30 capsule; Refill: 11  3. Depression, recurrent (HCC) - DULoxetine (CYMBALTA) 30 MG capsule; Take 2 capsules (60 mg total) by mouth 2 (two) times daily.  Dispense: 60 capsule; Refill: 11  4. Pure hypercholesterolemia - atorvastatin (LIPITOR) 40 MG tablet; Take 1 tablet (40 mg total) by mouth daily at 6 PM.  Dispense: 30 tablet; Refill: 11  5. Other migraine without status migrainosus, not intractable - topiramate (TOPAMAX) 100 MG tablet; Take 1 tablet (100 mg total) by mouth 2 (two) times daily.  Dispense: 60 tablet; Refill:  11   Current Outpatient Prescriptions:  .  Ascorbic Acid (VITAMIN C) 100 MG tablet, Take 100 mg by mouth daily., Disp: , Rfl:  .  aspirin EC 81 MG tablet, Take 81 mg by mouth every other day., Disp: , Rfl:  .  atorvastatin (LIPITOR) 40 MG tablet, Take 1 tablet (40 mg total) by mouth daily at 6 PM., Disp: 30 tablet, Rfl: 11 .  Doxepin HCl 5 % CREA, Apply 1 application topically 3 (three) times daily as needed (pain and itching)., Disp: 180 g, Rfl: 2 .  DULoxetine (CYMBALTA) 30 MG capsule, Take 2 capsules (60 mg total) by mouth 2 (two) times daily., Disp: 60 capsule, Rfl: 11 .  fluticasone (FLONASE) 50 MCG/ACT nasal spray, Place 1 spray into both nostrils 2 (two) times daily., Disp: 16 g, Rfl: 11 .  HYDROcodone-acetaminophen (NORCO) 10-325 MG tablet, Take 1 tablet by mouth every 6 (six) hours as needed., Disp: 120 tablet, Rfl: 0 .  lidocaine (XYLOCAINE) 5 % ointment, Apply 1 application topically 3 (three) times daily as needed., Disp: 2500 g, Rfl: 2 .  linaclotide (LINZESS) 145 MCG CAPS capsule, Take 145 mcg by mouth daily before breakfast. , Disp: , Rfl:  .  omeprazole (PRILOSEC) 20 MG capsule, Take 1 capsule (20 mg total) by mouth daily., Disp: 30 capsule, Rfl: 11 .  topiramate (TOPAMAX) 100 MG tablet, Take 1 tablet (100 mg total) by mouth 2 (two) times daily., Disp: 60 tablet, Rfl: 11 .  vitamin E 100 UNIT capsule, Take by mouth daily., Disp: , Rfl:  .  HYDROcodone-acetaminophen (NORCO) 10-325 MG tablet, Take 1 tablet by mouth every 6 (six) hours as needed., Disp: 120 tablet, Rfl: 0 .  HYDROcodone-acetaminophen (NORCO) 10-325 MG tablet, Take 1 tablet by mouth every 6 (six) hours as needed., Disp: 120 tablet, Rfl: 0  Continue all other maintenance medications as listed above.  Follow up plan: Return in about 3 months (  around 12/16/2016) for recheck.  Educational handout given for DDD  Terald Sleeper PA-C [facility] [address]  [place], [place]  [address] [phone]   09/16/2016, 12:48 PM

## 2016-09-16 NOTE — Patient Instructions (Signed)

## 2016-09-24 DIAGNOSIS — R0902 Hypoxemia: Secondary | ICD-10-CM | POA: Diagnosis not present

## 2016-10-25 DIAGNOSIS — R0902 Hypoxemia: Secondary | ICD-10-CM | POA: Diagnosis not present

## 2016-11-24 DIAGNOSIS — R0902 Hypoxemia: Secondary | ICD-10-CM | POA: Diagnosis not present

## 2016-12-23 ENCOUNTER — Encounter: Payer: Self-pay | Admitting: Physician Assistant

## 2016-12-23 ENCOUNTER — Ambulatory Visit (INDEPENDENT_AMBULATORY_CARE_PROVIDER_SITE_OTHER): Payer: Medicare Other | Admitting: Physician Assistant

## 2016-12-23 VITALS — BP 116/69 | HR 71 | Temp 97.8°F | Ht 63.0 in | Wt 143.6 lb

## 2016-12-23 DIAGNOSIS — K219 Gastro-esophageal reflux disease without esophagitis: Secondary | ICD-10-CM

## 2016-12-23 DIAGNOSIS — M5136 Other intervertebral disc degeneration, lumbar region: Secondary | ICD-10-CM

## 2016-12-23 DIAGNOSIS — J449 Chronic obstructive pulmonary disease, unspecified: Secondary | ICD-10-CM | POA: Diagnosis not present

## 2016-12-23 MED ORDER — HYDROCODONE-ACETAMINOPHEN 10-325 MG PO TABS: 1.0000 | ORAL_TABLET | Freq: Four times a day (QID) | ORAL | 0 refills | Status: DC | PRN

## 2016-12-23 MED ORDER — LIDOCAINE 5 % EX OINT: 1.0000 "application " | TOPICAL_OINTMENT | Freq: Three times a day (TID) | CUTANEOUS | 2 refills | Status: DC | PRN

## 2016-12-23 MED ORDER — OMEPRAZOLE 40 MG PO CPDR: 40.0000 mg | DELAYED_RELEASE_CAPSULE | Freq: Every day | ORAL | 11 refills | Status: DC

## 2016-12-23 NOTE — Patient Instructions (Signed)
In a few days you may receive a survey in the mail or online from [employer] regarding your visit with us today. Please take a moment to fill this out. Your feedback is very important to our whole office. It can help us better understand your needs as well as improve your experience and satisfaction. Thank you for taking your time to complete it. We care about you.  Bekki Tavenner, PA-C  

## 2016-12-23 NOTE — Progress Notes (Signed)
BP 116/69   Pulse 71   Temp 97.8 F (36.6 C) (Oral)   Ht '5\' 3"'  (1.6 m)   Wt 143 lb 9.6 oz (65.1 kg)   BMI 25.44 kg/m    Subjective:    Patient ID: Susan Ward, female    DOB: [birth year], 51 y.o.   MRN: [id number]  HPI: Susan Ward is a 51 y.o. female presenting on 12/23/2016 for Follow-up (3 month )  This patient comes in for periodic recheck on medications and conditions including GERD which has gotten worse. She is currently on omeprazole 20 mg. We'll increase to 40 mg. In one month's time if there is not improvement she is to call us and we will plan gastroenterology referral. It is been many years since she has had an EGD performed. She also comes in for recheck on her chronic pain related to degenerative disc disease. She states overall she is good and stable. Her medications are doing very well this time. She's had a little bit more soreness this summer. She admits to more activity in her garden. She reports that her breathing has been quite stable with her COPD. She has used minimal to no rescue inhaler. Declines smoking cessation efforts for now.  All medications are reviewed today. There are no reports of any problems with the medications. All of the medical conditions are reviewed and updated.  Lab work is reviewed and will be ordered as medically necessary. There are no new problems reported with today's visit.  Relevant past medical, surgical, family and social history reviewed and updated as indicated. Allergies and medications reviewed and updated.  Past Medical History:  Diagnosis Date  . Depression   . GERD (gastroesophageal reflux disease)   . Hyperlipidemia   . Mood disorder [facility])     Past Surgical History:  Procedure Laterality Date  . ABDOMINAL HYSTERECTOMY  1994  . BACK SURGERY     HAs had 4 back surgery  . CESAREAN SECTION      Review of Systems  Constitutional: Negative.  Negative for activity change, fatigue and fever.  HENT: Negative.   Eyes:  Negative.   Respiratory: Negative.  Negative for cough.   Cardiovascular: Negative.  Negative for chest pain.  Gastrointestinal: Positive for abdominal distention and abdominal pain. Negative for nausea and vomiting.  Endocrine: Negative.   Genitourinary: Negative.  Negative for dysuria.  Musculoskeletal: Positive for arthralgias, back pain and myalgias.  Skin: Negative.   Neurological: Negative.     Allergies as of 12/23/2016      Reactions   Aspirin Nausea And Vomiting   upset stomach   Oxycodone    Penicillin G Rash   Yeast Infection      Medication List       Accurate as of 12/23/16 12:05 PM. Always use your most recent med list.          aspirin EC 81 MG tablet Take 81 mg by mouth every other day.   atorvastatin 40 MG tablet Commonly known as:  LIPITOR Take 1 tablet (40 mg total) by mouth daily at 6 PM.   Doxepin HCl 5 % Crea Apply 1 application topically 3 (three) times daily as needed (pain and itching).   DULoxetine 30 MG capsule Commonly known as:  CYMBALTA Take 2 capsules (60 mg total) by mouth 2 (two) times daily.   fluticasone 50 MCG/ACT nasal spray Commonly known as:  FLONASE Place 1 spray into both nostrils 2 (two) times daily.  HYDROcodone-acetaminophen 10-325 MG tablet Commonly known as:  NORCO Take 1 tablet by mouth every 6 (six) hours as needed.   HYDROcodone-acetaminophen 10-325 MG tablet Commonly known as:  NORCO Take 1 tablet by mouth every 6 (six) hours as needed.   HYDROcodone-acetaminophen 10-325 MG tablet Commonly known as:  NORCO Take 1 tablet by mouth every 6 (six) hours as needed.   lidocaine 5 % ointment Commonly known as:  XYLOCAINE Apply 1 application topically 3 (three) times daily as needed.   linaclotide 145 MCG Caps capsule Commonly known as:  LINZESS Take 145 mcg by mouth daily before breakfast.   omeprazole 40 MG capsule Commonly known as:  PRILOSEC Take 1 capsule (40 mg total) by mouth daily.   topiramate 100 MG  tablet Commonly known as:  TOPAMAX Take 1 tablet (100 mg total) by mouth 2 (two) times daily.   vitamin C 100 MG tablet Take 100 mg by mouth daily.   vitamin E 100 UNIT capsule Take by mouth daily.          Objective:    BP 116/69   Pulse 71   Temp 97.8 F (36.6 C) (Oral)   Ht '5\' 3"'  (1.6 m)   Wt 143 lb 9.6 oz (65.1 kg)   BMI 25.44 kg/m   Allergies  Allergen Reactions  . Aspirin Nausea And Vomiting    upset stomach  . Oxycodone   . Penicillin G Rash    Yeast Infection    Physical Exam  Constitutional: She is oriented to person, place, and time. She appears well-developed and well-nourished.  HENT:  Head: Normocephalic and atraumatic.  Right Ear: Tympanic membrane, external ear and ear canal normal.  Left Ear: Tympanic membrane, external ear and ear canal normal.  Nose: Nose normal. No rhinorrhea.  Mouth/Throat: Oropharynx is clear and moist and mucous membranes are normal. No oropharyngeal exudate or posterior oropharyngeal erythema.  Eyes: Pupils are equal, round, and reactive to light. Conjunctivae and EOM are normal.  Neck: Normal range of motion. Neck supple.  Cardiovascular: Normal rate, regular rhythm, normal heart sounds and intact distal pulses.   Pulmonary/Chest: Effort normal and breath sounds normal.  Abdominal: Soft. Bowel sounds are normal.  Neurological: She is alert and oriented to person, place, and time. She has normal reflexes.  Skin: Skin is warm and dry. No rash noted.  Psychiatric: She has a normal mood and affect. Her behavior is normal. Judgment and thought content normal.  Nursing note and vitals reviewed.   Results for orders placed or performed in visit on 08/28/16  CBC with Differential/Platelet  Result Value Ref Range   WBC 10.3 3.4 - 10.8 x10E3/uL   RBC 4.60 3.77 - 5.28 x10E6/uL   Hemoglobin 14.5 11.1 - 15.9 g/dL   Hematocrit 42.8 34.0 - 46.6 %   MCV 93 79 - 97 fL   MCH 31.5 26.6 - 33.0 pg   MCHC 33.9 31.5 - 35.7 g/dL   RDW  13.2 12.3 - 15.4 %   Platelets 228 150 - 379 x10E3/uL   Neutrophils 56 Not Estab. %   Lymphs 33 Not Estab. %   Monocytes 7 Not Estab. %   Eos 4 Not Estab. %   Basos 0 Not Estab. %   Neutrophils Absolute 5.8 1.4 - 7.0 x10E3/uL   Lymphocytes Absolute 3.4 (H) 0.7 - 3.1 x10E3/uL   Monocytes Absolute 0.7 0.1 - 0.9 x10E3/uL   EOS (ABSOLUTE) 0.4 0.0 - 0.4 x10E3/uL   Basophils Absolute 0.0 0.0 -  0.2 x10E3/uL   Immature Granulocytes 0 Not Estab. %   Immature Grans (Abs) 0.0 0.0 - 0.1 x10E3/uL  Vitamin B12  Result Value Ref Range   Vitamin B-12 329 232 - 1,245 pg/mL  CMP14+EGFR  Result Value Ref Range   Glucose 90 65 - 99 mg/dL   BUN 14 6 - 24 mg/dL   Creatinine, Ser 0.69 0.57 - 1.00 mg/dL   GFR calc non Af Amer 100 >59 mL/min/1.73   GFR calc Af Amer 116 >59 mL/min/1.73   BUN/Creatinine Ratio 20 9 - 23   Sodium 143 134 - 144 mmol/L   Potassium 4.2 3.5 - 5.2 mmol/L   Chloride 105 96 - 106 mmol/L   CO2 22 18 - 29 mmol/L   Calcium 9.8 8.7 - 10.2 mg/dL   Total Protein 7.0 6.0 - 8.5 g/dL   Albumin 4.5 3.5 - 5.5 g/dL   Globulin, Total 2.5 1.5 - 4.5 g/dL   Albumin/Globulin Ratio 1.8 1.2 - 2.2   Bilirubin Total 0.2 0.0 - 1.2 mg/dL   Alkaline Phosphatase 65 39 - 117 IU/L   AST 17 0 - 40 IU/L   ALT 14 0 - 32 IU/L  Lipid panel  Result Value Ref Range   Cholesterol, Total 159 100 - 199 mg/dL   Triglycerides 228 (H) 0 - 149 mg/dL   HDL 38 (L) >39 mg/dL   VLDL Cholesterol Cal 46 (H) 5 - 40 mg/dL   LDL Calculated 75 0 - 99 mg/dL   Chol/HDL Ratio 4.2 0.0 - 4.4 ratio  TSH  Result Value Ref Range   TSH 1.300 0.450 - 4.500 uIU/mL      Assessment & Plan:   1. DDD (degenerative disc disease), lumbar - HYDROcodone-acetaminophen (NORCO) 10-325 MG tablet; Take 1 tablet by mouth every 6 (six) hours as needed.  Dispense: 120 tablet; Refill: 0 - HYDROcodone-acetaminophen (NORCO) 10-325 MG tablet; Take 1 tablet by mouth every 6 (six) hours as needed.  Dispense: 120 tablet; Refill: 0 -  HYDROcodone-acetaminophen (NORCO) 10-325 MG tablet; Take 1 tablet by mouth every 6 (six) hours as needed.  Dispense: 120 tablet; Refill: 0 - lidocaine (XYLOCAINE) 5 % ointment; Apply 1 application topically 3 (three) times daily as needed.  Dispense: 2500 g; Refill: 2  2. Gastroesophageal reflux disease without esophagitis - omeprazole (PRILOSEC) 40 MG capsule; Take 1 capsule (40 mg total) by mouth daily.  Dispense: 30 capsule; Refill: 11  3. Chronic obstructive pulmonary disease, unspecified COPD type ([place])    Current Outpatient Prescriptions:  .  Ascorbic Acid (VITAMIN C) 100 MG tablet, Take 100 mg by mouth daily., Disp: , Rfl:  .  aspirin EC 81 MG tablet, Take 81 mg by mouth every other day., Disp: , Rfl:  .  atorvastatin (LIPITOR) 40 MG tablet, Take 1 tablet (40 mg total) by mouth daily at 6 PM., Disp: 30 tablet, Rfl: 11 .  Doxepin HCl 5 % CREA, Apply 1 application topically 3 (three) times daily as needed (pain and itching)., Disp: 180 g, Rfl: 2 .  DULoxetine (CYMBALTA) 30 MG capsule, Take 2 capsules (60 mg total) by mouth 2 (two) times daily., Disp: 60 capsule, Rfl: 11 .  fluticasone (FLONASE) 50 MCG/ACT nasal spray, Place 1 spray into both nostrils 2 (two) times daily., Disp: 16 g, Rfl: 11 .  HYDROcodone-acetaminophen (NORCO) 10-325 MG tablet, Take 1 tablet by mouth every 6 (six) hours as needed., Disp: 120 tablet, Rfl: 0 .  HYDROcodone-acetaminophen (NORCO) 10-325 MG tablet, Take 1  tablet by mouth every 6 (six) hours as needed., Disp: 120 tablet, Rfl: 0 .  HYDROcodone-acetaminophen (NORCO) 10-325 MG tablet, Take 1 tablet by mouth every 6 (six) hours as needed., Disp: 120 tablet, Rfl: 0 .  lidocaine (XYLOCAINE) 5 % ointment, Apply 1 application topically 3 (three) times daily as needed., Disp: 2500 g, Rfl: 2 .  linaclotide (LINZESS) 145 MCG CAPS capsule, Take 145 mcg by mouth daily before breakfast. , Disp: , Rfl:  .  omeprazole (PRILOSEC) 40 MG capsule, Take 1 capsule (40 mg total) by  mouth daily., Disp: 30 capsule, Rfl: 11 .  topiramate (TOPAMAX) 100 MG tablet, Take 1 tablet (100 mg total) by mouth 2 (two) times daily., Disp: 60 tablet, Rfl: 11 .  vitamin E 100 UNIT capsule, Take by mouth daily., Disp: , Rfl:   Continue all other maintenance medications as listed above.  Follow up plan: Return in about 3 months (around 03/25/2017) for recheck.  Educational handout given for [place] PA-C [place] [address]  [place], [place] [address] [phone]   12/23/2016, 12:05 PM

## 2016-12-25 DIAGNOSIS — R0902 Hypoxemia: Secondary | ICD-10-CM | POA: Diagnosis not present

## 2017-01-25 DIAGNOSIS — R0902 Hypoxemia: Secondary | ICD-10-CM | POA: Diagnosis not present

## 2017-02-24 DIAGNOSIS — R0902 Hypoxemia: Secondary | ICD-10-CM | POA: Diagnosis not present

## 2017-03-25 ENCOUNTER — Encounter: Payer: Self-pay | Admitting: Physician Assistant

## 2017-03-25 ENCOUNTER — Ambulatory Visit (INDEPENDENT_AMBULATORY_CARE_PROVIDER_SITE_OTHER): Payer: Medicare Other | Admitting: Physician Assistant

## 2017-03-25 VITALS — BP 130/71 | HR 73 | Temp 97.5°F | Ht 63.0 in | Wt 142.6 lb

## 2017-03-25 DIAGNOSIS — M5136 Other intervertebral disc degeneration, lumbar region: Secondary | ICD-10-CM

## 2017-03-25 DIAGNOSIS — Z23 Encounter for immunization: Secondary | ICD-10-CM

## 2017-03-25 DIAGNOSIS — M51369 Other intervertebral disc degeneration, lumbar region without mention of lumbar back pain or lower extremity pain: Secondary | ICD-10-CM

## 2017-03-25 DIAGNOSIS — G43809 Other migraine, not intractable, without status migrainosus: Secondary | ICD-10-CM

## 2017-03-25 DIAGNOSIS — J329 Chronic sinusitis, unspecified: Secondary | ICD-10-CM | POA: Diagnosis not present

## 2017-03-25 MED ORDER — HYDROCODONE-ACETAMINOPHEN 10-325 MG PO TABS: 1.0000 | ORAL_TABLET | Freq: Four times a day (QID) | ORAL | 0 refills | Status: DC | PRN

## 2017-03-25 MED ORDER — DOXYCYCLINE HYCLATE 100 MG PO TABS: 100.0000 mg | ORAL_TABLET | Freq: Two times a day (BID) | ORAL | 0 refills | Status: DC

## 2017-03-25 MED ORDER — TOPIRAMATE 200 MG PO TABS: 100.0000 mg | ORAL_TABLET | Freq: Two times a day (BID) | ORAL | 6 refills | Status: DC

## 2017-03-25 NOTE — Progress Notes (Signed)
BP 130/71   Pulse 73   Temp (!) 97.5 F (36.4 C) (Oral)   Ht 5' 3" (1.6 m)   Wt 142 lb 9.6 oz (64.7 kg)   BMI 25.26 kg/m    Subjective:    Patient ID: Susan Ward, female    DOB: 03-13-1966, 51 y.o.   MRN: [id number]  HPI: Susan Ward is a 51 y.o. female presenting on 03/25/2017 for Follow-up (3 month ) and Ear Pain  This patient comes in for periodic recheck on medications and conditions including degenerative disc disease, recurrent sinusitis, migraine, chronic pain medication check.  Overall she has been doing well.  There are no new complaints with her back or neck.  She is still having migraines at times.  She has used Maxalt in the past.  She is also having some sinus issues. This patient has had many days of sinus headache and postnasal drainage. There is copious drainage at times. Denies any fever at this time. There has been a history of sinus infections in the past.  No history of sinus surgery. There is cough at night. It has become more prevalent in recent days.  All medications are reviewed today. There are no reports of any problems with the medications. All of the medical conditions are reviewed and updated.  Lab work is reviewed and will be ordered as medically necessary. There are no new problems reported with today's visit.    Relevant past medical, surgical, family and social history reviewed and updated as indicated. Allergies and medications reviewed and updated.  Past Medical History:  Diagnosis Date  . Depression   . GERD (gastroesophageal reflux disease)   . Hyperlipidemia   . Mood disorder [facility])     Past Surgical History:  Procedure Laterality Date  . ABDOMINAL HYSTERECTOMY  1994  . BACK SURGERY     HAs had 4 back surgery  . CESAREAN SECTION      Review of Systems  Constitutional: Positive for chills and fatigue. Negative for activity change and appetite change.  HENT: Positive for congestion, postnasal drip and sore throat.   Eyes: Negative.     Respiratory: Positive for cough and wheezing.   Cardiovascular: Negative.  Negative for chest pain, palpitations and leg swelling.  Gastrointestinal: Negative.   Genitourinary: Negative.   Musculoskeletal: Positive for arthralgias, back pain and myalgias.  Skin: Negative.   Neurological: Positive for headaches.    Allergies as of 03/25/2017      Reactions   Aspirin Nausea And Vomiting   upset stomach   Oxycodone    Penicillin G Rash   Yeast Infection      Medication List        Accurate as of 03/25/17  1:03 PM. Always use your most recent med list.          aspirin EC 81 MG tablet Take 81 mg by mouth every other day.   atorvastatin 40 MG tablet Commonly known as:  LIPITOR Take 1 tablet (40 mg total) by mouth daily at 6 PM.   Doxepin HCl 5 % Crea Apply 1 application topically 3 (three) times daily as needed (pain and itching).   doxycycline 100 MG tablet Commonly known as:  VIBRA-TABS Take 1 tablet (100 mg total) 2 (two) times daily by mouth.   DULoxetine 30 MG capsule Commonly known as:  CYMBALTA Take 2 capsules (60 mg total) by mouth 2 (two) times daily.   fluticasone 50 MCG/ACT nasal spray Commonly known as:  FLONASE Place 1 spray into both nostrils 2 (two) times daily.   HYDROcodone-acetaminophen 10-325 MG tablet Commonly known as:  NORCO Take 1 tablet every 6 (six) hours as needed by mouth.   HYDROcodone-acetaminophen 10-325 MG tablet Commonly known as:  NORCO Take 1 tablet every 6 (six) hours as needed by mouth.   HYDROcodone-acetaminophen 10-325 MG tablet Commonly known as:  NORCO Take 1 tablet every 6 (six) hours as needed by mouth.   lidocaine 5 % ointment Commonly known as:  XYLOCAINE Apply 1 application topically 3 (three) times daily as needed.   linaclotide 145 MCG Caps capsule Commonly known as:  LINZESS Take 145 mcg by mouth daily before breakfast.   omeprazole 40 MG capsule Commonly known as:  PRILOSEC Take 1 capsule (40 mg total)  by mouth daily.   topiramate 200 MG tablet Commonly known as:  TOPAMAX Take 0.5 tablets (100 mg total) 2 (two) times daily by mouth.   vitamin C 100 MG tablet Take 100 mg by mouth daily.   vitamin E 100 UNIT capsule Take by mouth daily.          Objective:    BP 130/71   Pulse 73   Temp (!) 97.5 F (36.4 C) (Oral)   Ht 5' 3" (1.6 m)   Wt 142 lb 9.6 oz (64.7 kg)   BMI 25.26 kg/m   Allergies  Allergen Reactions  . Aspirin Nausea And Vomiting    upset stomach  . Oxycodone   . Penicillin G Rash    Yeast Infection    Physical Exam  Constitutional: She is oriented to person, place, and time. She appears well-developed and well-nourished.  HENT:  Head: Normocephalic and atraumatic.  Right Ear: Tympanic membrane and external ear normal. No middle ear effusion.  Left Ear: Tympanic membrane and external ear normal.  No middle ear effusion.  Nose: Mucosal edema and rhinorrhea present. Right sinus exhibits no maxillary sinus tenderness. Left sinus exhibits no maxillary sinus tenderness.  Mouth/Throat: Uvula is midline. Posterior oropharyngeal erythema present.  Eyes: Conjunctivae and EOM are normal. Pupils are equal, round, and reactive to light. Right eye exhibits no discharge. Left eye exhibits no discharge.  Neck: Normal range of motion.  Cardiovascular: Normal rate, regular rhythm and normal heart sounds.  Pulmonary/Chest: Effort normal and breath sounds normal. No respiratory distress. She has no wheezes.  Abdominal: Soft.  Lymphadenopathy:    She has no cervical adenopathy.  Neurological: She is alert and oriented to person, place, and time.  Skin: Skin is warm and dry.  Psychiatric: She has a normal mood and affect.    Results for orders placed or performed in visit on 08/28/16  CBC with Differential/Platelet  Result Value Ref Range   WBC 10.3 3.4 - 10.8 x10E3/uL   RBC 4.60 3.77 - 5.28 x10E6/uL   Hemoglobin 14.5 11.1 - 15.9 g/dL   Hematocrit 42.8 34.0 - 46.6 %    MCV 93 79 - 97 fL   MCH 31.5 26.6 - 33.0 pg   MCHC 33.9 31.5 - 35.7 g/dL   RDW 13.2 12.3 - 15.4 %   Platelets 228 150 - 379 x10E3/uL   Neutrophils 56 Not Estab. %   Lymphs 33 Not Estab. %   Monocytes 7 Not Estab. %   Eos 4 Not Estab. %   Basos 0 Not Estab. %   Neutrophils Absolute 5.8 1.4 - 7.0 x10E3/uL   Lymphocytes Absolute 3.4 (H) 0.7 - 3.1 x10E3/uL   Monocytes Absolute  0.7 0.1 - 0.9 x10E3/uL   EOS (ABSOLUTE) 0.4 0.0 - 0.4 x10E3/uL   Basophils Absolute 0.0 0.0 - 0.2 x10E3/uL   Immature Granulocytes 0 Not Estab. %   Immature Grans (Abs) 0.0 0.0 - 0.1 x10E3/uL  Vitamin B12  Result Value Ref Range   Vitamin B-12 329 232 - 1,245 pg/mL  CMP14+EGFR  Result Value Ref Range   Glucose 90 65 - 99 mg/dL   BUN 14 6 - 24 mg/dL   Creatinine, Ser 0.69 0.57 - 1.00 mg/dL   GFR calc non Af Amer 100 >59 mL/min/1.73   GFR calc Af Amer 116 >59 mL/min/1.73   BUN/Creatinine Ratio 20 9 - 23   Sodium 143 134 - 144 mmol/L   Potassium 4.2 3.5 - 5.2 mmol/L   Chloride 105 96 - 106 mmol/L   CO2 22 18 - 29 mmol/L   Calcium 9.8 8.7 - 10.2 mg/dL   Total Protein 7.0 6.0 - 8.5 g/dL   Albumin 4.5 3.5 - 5.5 g/dL   Globulin, Total 2.5 1.5 - 4.5 g/dL   Albumin/Globulin Ratio 1.8 1.2 - 2.2   Bilirubin Total 0.2 0.0 - 1.2 mg/dL   Alkaline Phosphatase 65 39 - 117 IU/L   AST 17 0 - 40 IU/L   ALT 14 0 - 32 IU/L  Lipid panel  Result Value Ref Range   Cholesterol, Total 159 100 - 199 mg/dL   Triglycerides 228 (H) 0 - 149 mg/dL   HDL 38 (L) >39 mg/dL   VLDL Cholesterol Cal 46 (H) 5 - 40 mg/dL   LDL Calculated 75 0 - 99 mg/dL   Chol/HDL Ratio 4.2 0.0 - 4.4 ratio  TSH  Result Value Ref Range   TSH 1.300 0.450 - 4.500 uIU/mL      Assessment & Plan:   1. DDD (degenerative disc disease), lumbar - HYDROcodone-acetaminophen (NORCO) 10-325 MG tablet; Take 1 tablet every 6 (six) hours as needed by mouth.  Dispense: 120 tablet; Refill: 0 - HYDROcodone-acetaminophen (NORCO) 10-325 MG tablet; Take 1 tablet  every 6 (six) hours as needed by mouth.  Dispense: 120 tablet; Refill: 0 - HYDROcodone-acetaminophen (NORCO) 10-325 MG tablet; Take 1 tablet every 6 (six) hours as needed by mouth.  Dispense: 120 tablet; Refill: 0  2. Chronic sinusitis, unspecified location - doxycycline (VIBRA-TABS) 100 MG tablet; Take 1 tablet (100 mg total) 2 (two) times daily by mouth.  Dispense: 20 tablet; Refill: 0  3. Other migraine without status migrainosus, not intractable - topiramate (TOPAMAX) 200 MG tablet; Take 0.5 tablets (100 mg total) 2 (two) times daily by mouth.  Dispense: 60 tablet; Refill: 6  4. Need for immunization against influenza - Flu Vaccine QUAD 36+ mos IM    Current Outpatient Medications:  .  Ascorbic Acid (VITAMIN C) 100 MG tablet, Take 100 mg by mouth daily., Disp: , Rfl:  .  atorvastatin (LIPITOR) 40 MG tablet, Take 1 tablet (40 mg total) by mouth daily at 6 PM., Disp: 30 tablet, Rfl: 11 .  Doxepin HCl 5 % CREA, Apply 1 application topically 3 (three) times daily as needed (pain and itching)., Disp: 180 g, Rfl: 2 .  DULoxetine (CYMBALTA) 30 MG capsule, Take 2 capsules (60 mg total) by mouth 2 (two) times daily., Disp: 60 capsule, Rfl: 11 .  fluticasone (FLONASE) 50 MCG/ACT nasal spray, Place 1 spray into both nostrils 2 (two) times daily., Disp: 16 g, Rfl: 11 .  HYDROcodone-acetaminophen (NORCO) 10-325 MG tablet, Take 1 tablet every 6 (six)  hours as needed by mouth., Disp: 120 tablet, Rfl: 0 .  HYDROcodone-acetaminophen (NORCO) 10-325 MG tablet, Take 1 tablet every 6 (six) hours as needed by mouth., Disp: 120 tablet, Rfl: 0 .  HYDROcodone-acetaminophen (NORCO) 10-325 MG tablet, Take 1 tablet every 6 (six) hours as needed by mouth., Disp: 120 tablet, Rfl: 0 .  lidocaine (XYLOCAINE) 5 % ointment, Apply 1 application topically 3 (three) times daily as needed., Disp: 2500 g, Rfl: 2 .  linaclotide (LINZESS) 145 MCG CAPS capsule, Take 145 mcg by mouth daily before breakfast. , Disp: , Rfl:  .   omeprazole (PRILOSEC) 40 MG capsule, Take 1 capsule (40 mg total) by mouth daily., Disp: 30 capsule, Rfl: 11 .  topiramate (TOPAMAX) 200 MG tablet, Take 0.5 tablets (100 mg total) 2 (two) times daily by mouth., Disp: 60 tablet, Rfl: 6 .  vitamin E 100 UNIT capsule, Take by mouth daily., Disp: , Rfl:  .  aspirin EC 81 MG tablet, Take 81 mg by mouth every other day., Disp: , Rfl:  .  doxycycline (VIBRA-TABS) 100 MG tablet, Take 1 tablet (100 mg total) 2 (two) times daily by mouth., Disp: 20 tablet, Rfl: 0 Continue all other maintenance medications as listed above.  Follow up plan: Return in about 3 months (around 06/25/2017) for recheck.  Educational handout given for [place] PA-C [facility] [address]  [place], [place] [address] [phone]   03/25/2017, 1:03 PM

## 2017-03-25 NOTE — Patient Instructions (Signed)
In a few days you may receive a survey in the mail or online from [employer] regarding your visit with us today. Please take a moment to fill this out. Your feedback is very important to our whole office. It can help us better understand your needs as well as improve your experience and satisfaction. Thank you for taking your time to complete it. We care about you.  Avery Klingbe[place], PA-C  

## 2017-03-27 DIAGNOSIS — R0902 Hypoxemia: Secondary | ICD-10-CM | POA: Diagnosis not present

## 2017-03-31 ENCOUNTER — Encounter: Payer: Self-pay | Admitting: Family Medicine

## 2017-03-31 ENCOUNTER — Ambulatory Visit: Payer: Medicare Other | Admitting: Family Medicine

## 2017-03-31 VITALS — BP 135/70 | HR 74 | Temp 97.8°F | Ht 63.0 in | Wt 145.0 lb

## 2017-03-31 DIAGNOSIS — M5441 Lumbago with sciatica, right side: Secondary | ICD-10-CM

## 2017-03-31 DIAGNOSIS — G8929 Other chronic pain: Secondary | ICD-10-CM

## 2017-03-31 DIAGNOSIS — M5442 Lumbago with sciatica, left side: Secondary | ICD-10-CM

## 2017-03-31 DIAGNOSIS — M5136 Other intervertebral disc degeneration, lumbar region: Secondary | ICD-10-CM | POA: Diagnosis not present

## 2017-03-31 MED ORDER — METHYLPREDNISOLONE ACETATE 80 MG/ML IJ SUSP
80.0000 mg | Freq: Once | INTRAMUSCULAR | Status: AC
  Administered 2017-03-31: 80 mg via INTRAMUSCULAR

## 2017-03-31 MED ORDER — METHYLPREDNISOLONE ACETATE 80 MG/ML IJ SUSP: 80.0000 mg | Freq: Once | INTRAMUSCULAR | Status: DC

## 2017-03-31 NOTE — Patient Instructions (Signed)
You received a steroid injection today.  We discussed alternatives including Toradol.  As we discussed, if you develop any other worsening symptoms including numbness and tingling in the groin, fecal incontinence or urinary retention, please seek immediate medical attention in the emergency department as this is a surgical emergency.  Please continue to follow-up with [facility] regarding your chronic pain management.   Herniated Disk A herniated disk, also called a ruptured disk or slipped disk, occurs when a disk in the spine bulges out too far. Between the bones in the spine (vertebrae), there are oval disks that are made of a soft, spongy center that is surrounded by a tough outer ring. The disks connect your vertebrae, help your spine move, and absorb shocks from your movement. When you have a herniated disk, the spongy center of the disk bulges out or breaks through the outer ring. It can press on a nerve between the vertebrae and cause pain. This can occur anywhere in the back or neck area, but the lower back is most commonly affected. What are the causes? This condition may be caused by:  Age-related wear and tear. The spongy centers of spinal disks tend to shrink and dry out with age, which makes them more likely to herniate.  Sudden injury, such as a strain or sprain.  What increases the risk? Aging is the main risk factor for a herniated disk. Other risk factors include:  Being a man who is 89-62 years old.  Frequently doing activities that involve heavy lifting, bending, or twisting.  Frequently driving for long hours at a time.  Not getting enough exercise.  Being overweight.  Smoking.  Having a family history of back problems or herniated disks.  Being pregnant or giving birth.  Having poor nutrition.  Being tall.  What are the signs or symptoms? Symptoms may vary depending on where your herniated disk is located.  A herniated disk in the lower back may cause sharp  pain in: ? Part of the arm, leg, hip, or buttocks. ? The back of the lower leg (calf). ? The lower back, spreading down through the leg into the foot (sciatica).  A herniated disk in the neck may cause dizziness and vertigo. It may also cause pain or weakness in: ? The neck. ? The shoulder blades. ? Upper arm, forearm, or fingers.  You may also have muscle weakness. It may be difficult to: ? Lift your leg or arm. ? Stand on your toes. ? Squeeze tightly with one of your hands.  Other symptoms may include: ? Numbness or tingling in the affected areas of the hands, arms, feet, or legs. ? Inability to control when you urinate or when you have bowel movements. This is a rare but serious sign of a severe herniated disk in the lower back.  How is this diagnosed? This condition may be diagnosed based on:  Your symptoms.  Your medical history.  A physical exam. The exam may include: ? Straight-leg test. You will lie on your back while your health care provider lifts your leg, keeping your knee straight. If you feel pain, you likely have a herniated disk. ? Neurological tests. This includes checking for numbness, reflexes, muscle strength, and posture.  Imaging tests, such as: ? X-rays. ? MRI. ? CT scan. ? Electromyogram (EMG) to check the nerves that control muscles. This test may be used to determine which nerves are affected by your herniated disk.  How is this treated? Treatment for this condition may  include:  A short period of rest. This is usually the first treatment. ? You may be on bed rest for up to 2 days, or you may be instructed to stay home and avoid physical activity. ? If you have a herniated disk in your lower back, avoid sitting as much as possible. Sitting increases pressure on the disk.  Medicines. These may include: ? NSAIDs to help reduce pain and swelling. ? Muscle relaxants to prevent sudden tightening of the back muscles (back spasms). ? Prescription pain  medicines, if you have severe pain.  Steroid injections in the area of the herniated disk. This can help reduce pain and swelling.  Physical therapy to strengthen your back muscles.  In many cases, symptoms go away with treatment over a period of days or weeks. You will most likely be free of symptoms after 3-4 months. If other treatments do not help to relieve your symptoms, you may need surgery. Follow these instructions at home: Medicines  Take over-the-counter and prescription medicines only as told by your health care provider.  Do not drive or use heavy machinery while taking prescription pain medicine. Activity  Rest as directed.  After your rest period: ? Return to your normal activities and gradually begin exercising as told by your health care provider. Ask your health care provider what activities and exercises are safe for you. ? Use good posture. ? Avoid movements that cause pain. ? Do not lift anything that is heavier than 10 lb (4.5 kg) until your health care provider says this is safe. ? Do not sit or stand for long periods of time without changing positions. ? Do not sit for long periods of time without getting up and moving around.  If physical therapy was prescribed, do exercises as instructed.  Aim to strengthen muscles in your back and abdomen with exercises like crunches, swimming, or walking. General instructions  Do not use any products that contain nicotine or tobacco, such as cigarettes and e-cigarettes. These products can delay healing. If you need help quitting, ask your health care provider.  Do not wear high-heeled shoes.  Do not sleep on your belly.  If you are overweight, work with your health care provider to lose weight safely.  To prevent or treat constipation while you are taking prescription pain medicine, your health care provider may recommend that you: ? Drink enough fluid to keep your urine clear or pale yellow. ? Take over-the-counter  or prescription medicines. ? Eat foods that are high in fiber, such as fresh fruits and vegetables, whole grains, and beans. ? Limit foods that are high in fat and processed sugars, such as fried and sweet foods.  Keep all follow-up visits as told by your health care provider. This is important. How is this prevented?  Maintain a healthy weight.  Try to avoid stressful situations.  Maintain physical fitness. Do at least 150 minutes of moderate-intensity exercise each week, such as brisk walking or water aerobics.  When lifting objects: ? Keep your feet at least shoulder-width apart and tighten your abdominal muscles. ? Keep your spine neutral as you bend your knees and hips. It is important to lift using the strength of your legs, not your back. Do not lock your knees straight out. ? Always ask for help to lift heavy or awkward objects. Contact a health care provider if:  You have back pain or neck pain that does not get better after 6 weeks.  You have severe pain in your  back, neck, legs, or arms.  You develop numbness, tingling, or weakness in any part of your body. Get help right away if:  You cannot move your arms or legs.  You cannot control when you urinate or have bowel movements.  You feel dizzy or you faint.  You have shortness of breath. This information is not intended to replace advice given to you by your health care provider. Make sure you discuss any questions you have with your health care provider. Document Released: 05/03/2000 Document Revised: 01/01/2016 Document Reviewed: 01/01/2016 Elsevier Interactive Patient Education  2017 [employer].

## 2017-03-31 NOTE — Progress Notes (Signed)
Subjective: CC: back pain HPI: Patient is a 51 y.o. female presenting to clinic today for back pain. Concerns today include:  1. Back Pain Patient was seen on 03/25/2017 for chronic back pain.  She is currently prescribed Norco 10 mg 4 times daily.  She has a CT scan of her thoracic and lumbar from 2010 which revealed multilevel degenerative changes.   Patient reports that pain began 4 days ago.  Pain is a 10/10.  It does radiate to bilateral lower legs.  Changing positions worsens pain.  Nothing improves pain.  Patient has been taking Norco 10 mg more than 4 times daily for pain with little relief.  She has required the use of a cane secondary to pain in her back.  She reports she has about 2 flares per year.  No recent corticosteroid use.  She notes that she was told by her surgeon that she did require repeat surgery.  She has had back injections under fluoroscopy with little improvement in the past.  She is trying to stay away from having surgery done at this time.  She notes a recent MRI in 2017.  Patient denies trauma or injury.  Denies saddle anesthesia, urinary retention/incontinence, bowel incontinence, weakness, falls, sensation changes or pain anywhere else.   Past Medical History:  Diagnosis Date  . Depression   . GERD (gastroesophageal reflux disease)   . Hyperlipidemia   . Mood disorder [facility])    Past Surgical History:  Procedure Laterality Date  . ABDOMINAL HYSTERECTOMY  1994  . BACK SURGERY     HAs had 4 back surgery  . CESAREAN SECTION     Social History   Socioeconomic History  . Marital status: Married    Spouse name: Not on file  . Number of children: Not on file  . Years of education: Not on file  . Highest education level: Not on file  Social Needs  . Financial resource strain: Not on file  . Food insecurity - worry: Not on file  . Food insecurity - inability: Not on file  . Transportation needs - medical: Not on file  . Transportation needs - non-medical:  Not on file  Occupational History  . Not on file  Tobacco Use  . Smoking status: Current Every Day Smoker  . Smokeless tobacco: Never Used  Substance and Sexual Activity  . Alcohol use: No  . Drug use: No  . Sexual activity: Not on file  Other Topics Concern  . Not on file  Social History Narrative  . Not on file    Current Outpatient Medications:  .  Ascorbic Acid (VITAMIN C) 100 MG tablet, Take 100 mg by mouth daily., Disp: , Rfl:  .  aspirin EC 81 MG tablet, Take 81 mg by mouth every other day., Disp: , Rfl:  .  atorvastatin (LIPITOR) 40 MG tablet, Take 1 tablet (40 mg total) by mouth daily at 6 PM., Disp: 30 tablet, Rfl: 11 .  Doxepin HCl 5 % CREA, Apply 1 application topically 3 (three) times daily as needed (pain and itching)., Disp: 180 g, Rfl: 2 .  doxycycline (VIBRA-TABS) 100 MG tablet, Take 1 tablet (100 mg total) 2 (two) times daily by mouth., Disp: 20 tablet, Rfl: 0 .  DULoxetine (CYMBALTA) 30 MG capsule, Take 2 capsules (60 mg total) by mouth 2 (two) times daily., Disp: 60 capsule, Rfl: 11 .  fluticasone (FLONASE) 50 MCG/ACT nasal spray, Place 1 spray into both nostrils 2 (two) times daily.,  Disp: 16 g, Rfl: 11 .  HYDROcodone-acetaminophen (NORCO) 10-325 MG tablet, Take 1 tablet every 6 (six) hours as needed by mouth., Disp: 120 tablet, Rfl: 0 .  HYDROcodone-acetaminophen (NORCO) 10-325 MG tablet, Take 1 tablet every 6 (six) hours as needed by mouth., Disp: 120 tablet, Rfl: 0 .  HYDROcodone-acetaminophen (NORCO) 10-325 MG tablet, Take 1 tablet every 6 (six) hours as needed by mouth., Disp: 120 tablet, Rfl: 0 .  lidocaine (XYLOCAINE) 5 % ointment, Apply 1 application topically 3 (three) times daily as needed., Disp: 2500 g, Rfl: 2 .  linaclotide (LINZESS) 145 MCG CAPS capsule, Take 145 mcg by mouth daily before breakfast. , Disp: , Rfl:  .  omeprazole (PRILOSEC) 40 MG capsule, Take 1 capsule (40 mg total) by mouth daily., Disp: 30 capsule, Rfl: 11 .  topiramate (TOPAMAX) 200  MG tablet, Take 0.5 tablets (100 mg total) 2 (two) times daily by mouth., Disp: 60 tablet, Rfl: 6 .  vitamin E 100 UNIT capsule, Take by mouth daily., Disp: , Rfl:   Allergies  Allergen Reactions  . Aspirin Nausea And Vomiting    upset stomach  . Oxycodone   . Penicillin G Rash    Yeast Infection   ROS: All other systems reviewed and are negative.  Objective: Office vital signs reviewed. BP 135/70   Pulse 74   Temp 97.8 F (36.6 C) (Oral)   Ht 5\' 3"  (1.6 m)   Wt 145 lb (65.8 kg)   BMI 25.69 kg/m   Physical Examination:  General: Awake, alert, chronically ill appearing female, appears uncomfortable Pulm: normal work of breathing on room air. Extremities: WWP, No edema, cyanosis or clubbing; +1 pulses bilaterally MSK: antalgic gait and station  Spine: decreased ROM in all planes secondary to pain, pain with straight leg test bilaterally but worse on the left, she has TTP to lumbar paraspinals bilaterally, no bony deformities Neuro: Strength testing limited by pain.  Light touch sensation grossly intact  Assessment/ Plan: 51 y.o. female   1. Chronic bilateral low back pain with bilateral sciatica Acute exacerbation of chronic bilateral low back pain.  She has been taking more than what is prescribed of the Norco.  I did advise against this.  I offered a Toradol injection today for acute flare.  Patient prefers corticosteroid injection as this is worked well in the past for her.  Depo-Medrol 80 mg was administered IM.  Referral to [facility] was declined today.  Patient does not wish to revisit back injections or surgery at this time.  Strict return precautions and reasons for emergent evaluation in the emergency department were reviewed with the patient. - methylPREDNISolone acetate (DEPO-MEDROL) injection 80 mg  2. DDD (degenerative disc disease), lumbar - methylPREDNISolone acetate (DEPO-MEDROL) injection 80 mg  Janora Norlander, DO [facility]

## 2017-04-26 DIAGNOSIS — R0902 Hypoxemia: Secondary | ICD-10-CM | POA: Diagnosis not present

## 2017-05-15 ENCOUNTER — Ambulatory Visit (INDEPENDENT_AMBULATORY_CARE_PROVIDER_SITE_OTHER): Payer: Medicare Other | Admitting: *Deleted

## 2017-05-15 VITALS — BP 136/75 | HR 69 | Temp 98.0°F | Ht 61.0 in | Wt 147.4 lb

## 2017-05-15 DIAGNOSIS — Z0000 Encounter for general adult medical examination without abnormal findings: Secondary | ICD-10-CM | POA: Diagnosis not present

## 2017-05-15 NOTE — Patient Instructions (Signed)
  Susan Ward , Thank you for taking time to come for your Medicare Wellness Visit. I appreciate your ongoing commitment to your health goals. Please review the following plan we discussed and let me know if I can assist you in the future.   These are the goals we discussed: Goals    . Exercise 3x per week (30 min per time)       This is a list of the screening recommended for you and due dates:  Health Maintenance  Topic Date Due  .  Hepatitis C: One time screening is recommended by Center for Disease Control  (CDC) for  adults born from [birth year] through 1967.   10/18/1965  . HIV Screening  01/31/1979  . Tetanus Vaccine  01/31/1983  . Pap Smear  01/30/1985  . Mammogram  01/30/2014  . Colon Cancer Screening  01/30/2014  . Flu Shot  Completed   Please call and schedule eye exam We will call and get last colonoscopy date and let you know when you are next due. We will put in a referral for your mammogram and call with an appointment Follow up with [facility] as planned for physical.

## 2017-05-15 NOTE — Progress Notes (Signed)
Subjective:   Susan Ward is a 51 y.o. female who presents for an Initial Medicare Annual Wellness Visit. Patient is here today for her Initial Medicare Wellness Visit. 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 She enjoys reading in her free time. She states that she walks daily for exercise. She states her diet is semi-healthy. Patient lives in [place] with her husband. Patient has 2 sons. She does have 2 dogs in the home. Patient does state that she feels her health is worse than it was last year at this time, due to her back, hip, and leg pain. Patient has not had any recent hospitalizations or surgeries. Fall hazards were discussed with patient.  Review of Systems      Cardiac Risk Factors include: smoking/ tobacco exposure     Objective:    Today's Vitals   05/15/17 0849  BP: 136/75  Pulse: 69  Temp: 98 F (36.7 C)  TempSrc: Oral  Weight: 147 lb 6.4 oz (66.9 kg)  Height: 5\' 1"  (1.549 m)   Body mass index is 27.85 kg/m.  Advanced Directives 05/15/2017  Does Patient Have a Medical Advance Directive? No  Would patient like information on creating a medical advance directive? Yes (MAU/Ambulatory/Procedural Areas - Information given)    Current Medications (verified) Outpatient Encounter Medications as of 05/15/2017  Medication Sig  . Ascorbic Acid (VITAMIN C) 100 MG tablet Take 100 mg by mouth daily.  Marland Kitchen atorvastatin (LIPITOR) 40 MG tablet Take 1 tablet (40 mg total) by mouth daily at 6 PM.  . DULoxetine (CYMBALTA) 30 MG capsule Take 2 capsules (60 mg total) by mouth 2 (two) times daily.  . fluticasone (FLONASE) 50 MCG/ACT nasal spray Place 1 spray into both nostrils 2 (two) times daily.  Marland Kitchen HYDROcodone-acetaminophen (NORCO) 10-325 MG tablet Take 1 tablet every 6 (six) hours as needed by mouth.  Marland Kitchen HYDROcodone-acetaminophen (NORCO) 10-325 MG tablet Take 1 tablet every 6 (six) hours as needed by mouth.  Marland Kitchen HYDROcodone-acetaminophen (NORCO) 10-325 MG tablet Take 1 tablet  every 6 (six) hours as needed by mouth.  . linaclotide (LINZESS) 145 MCG CAPS capsule Take 145 mcg by mouth daily before breakfast.   . omeprazole (PRILOSEC) 40 MG capsule Take 1 capsule (40 mg total) by mouth daily.  Marland Kitchen topiramate (TOPAMAX) 200 MG tablet Take 0.5 tablets (100 mg total) 2 (two) times daily by mouth.  . vitamin E 100 UNIT capsule Take by mouth daily.  . [DISCONTINUED] Doxepin HCl 5 % CREA Apply 1 application topically 3 (three) times daily as needed (pain and itching).  . [DISCONTINUED] lidocaine (XYLOCAINE) 5 % ointment Apply 1 application topically 3 (three) times daily as needed. (Patient not taking: Reported on 05/15/2017)   No facility-administered encounter medications on file as of 05/15/2017.     Allergies (verified) Aspirin; Oxycodone; and Penicillin g   History: Past Medical History:  Diagnosis Date  . Depression   . GERD (gastroesophageal reflux disease)   . Hyperlipidemia   . Mood disorder [facility])    Past Surgical History:  Procedure Laterality Date  . ABDOMINAL HYSTERECTOMY  1994  . BACK SURGERY     HAs had 4 back surgery  . CESAREAN SECTION     x2  . EYE SURGERY     Family History  Problem Relation Age of Onset  . Cancer Mother        Gallbladder  . Diabetes Mother   . Hypertension Mother   . COPD Father   .  Cancer Father        Colon, lung, brain tumor  . Diabetes Father   . Hypertension Father   . Cancer Sister        leukemia   . Hypertension Sister   . Diabetes Sister   . COPD Sister   . Cancer Maternal Aunt        breast  . Cancer Paternal Aunt        skin   . Cancer Sister        ovarian   . Heart disease Paternal Aunt    Social History   Socioeconomic History  . Marital status: Married    Spouse name: None  . Number of children: 2  . Years of education: None  . Highest education level: None  Social Needs  . Financial resource strain: Not hard at all  . Food insecurity - worry: Never true  . Food insecurity -  inability: Never true  . Transportation needs - medical: No  . Transportation needs - non-medical: No  Occupational History  . None  Tobacco Use  . Smoking status: Current Every Day Smoker    Packs/day: 1.50    Years: 39.00    Pack years: 58.50    Types: Cigarettes  . Smokeless tobacco: Never Used  Substance and Sexual Activity  . Alcohol use: No  . Drug use: No  . Sexual activity: Yes    Birth control/protection: Surgical  Other Topics Concern  . None  Social History Narrative  . None    Tobacco Counseling Ready to quit: No Counseling given: Not Answered Patient is a smoker and is not ready to quit.   Clinical Intake:   Activities of Daily Living In your present state of health, do you have any difficulty performing the following activities: 05/15/2017  Hearing? N  Vision? Y  Comment Patient states that she does need glasses just unable to afford at this time  Difficulty concentrating or making decisions? N  Walking or climbing stairs? N  Dressing or bathing? N  Doing errands, shopping? N  Preparing Food and eating ? N  Using the Toilet? N  In the past six months, have you accidently leaked urine? N  Do you have problems with loss of bowel control? N  Managing your Medications? N  Managing your Finances? N  Housekeeping or managing your Housekeeping? N  Some recent data might be hidden  Patient has no concerns with hearing Patient does have concerns with vision she does state that she needs glasses but can not afford at this time. Patient advised to schedule a eye exam.  Immunizations and Health Maintenance Immunization History  Administered Date(s) Administered  . Influenza,inj,Quad PF,6+ Mos 03/05/2016, 03/25/2017  . Pneumococcal Polysaccharide-23 03/05/2016  . Tdap 01/23/2016   Health Maintenance Due  Topic Date Due  . Hepatitis C Screening  [birth year]  . HIV Screening  01/31/1979  . TETANUS/TDAP  01/31/1983  . PAP SMEAR  01/30/1985  . MAMMOGRAM   01/30/2014  . COLONOSCOPY  01/30/2014  Patient was scheduled for a physical with Glenard Haring on 06/25/2017 We are going to place referral for mammogram We will call and get colonoscopy report from [facility] and see when she is due again.   Patient Care Team: Theodoro Clock as PCP - General (Physician Assistant)  Indicate any recent Medical Services you may have received from other than [facility] providers in the past year (date may be approximate).     Assessment:  This is a routine wellness examination for Susan Ward.  Hearing/Vision screen No exam data present Patient has no concerns with hearing  Patient does have concerns about her vision and does need an eye exam. Patient states that she will call and schedule. Dietary issues and exercise activities discussed: Current Exercise Habits: Home exercise routine, Type of exercise: walking, Time (Minutes): 30, Frequency (Times/Week): 5, Weekly Exercise (Minutes/Week): 150, Intensity: Mild, Exercise limited by: orthopedic condition(s)  Goals    . Exercise 3x per week (30 min per time)      Depression Screen PHQ 2/9 Scores 05/15/2017 03/31/2017 03/25/2017 12/23/2016 09/16/2016 08/28/2016 07/16/2016  PHQ - 2 Score 0 0 0 0 0 0 0    Fall Risk Fall Risk  05/15/2017 03/31/2017 09/16/2016 08/28/2016 07/16/2016  Falls in the past year? No No No No No    Is the patient's home free of loose throw rugs in walkways, pet beds, electrical cords, etc?   yes      Grab bars in the bathroom? no      Adequate lighting?   yes Fall hazards were discussed with patient    Cognitive Function: MMSE - Mini Mental State Exam 05/15/2017  Orientation to time 5  Orientation to Place 5  Registration 3  Attention/ Calculation 5  Recall 3  Language- name 2 objects 2  Language- repeat 1  Language- follow 3 step command 3  Language- read & follow direction 1  Write a sentence 1  Copy design 1  Total score 30    Patient scored a 30 out of 30.  Screening  Tests Health Maintenance  Topic Date Due  . Hepatitis C Screening  02-07-1966  . HIV Screening  01/31/1979  . TETANUS/TDAP  01/31/1983  . PAP SMEAR  01/30/1985  . MAMMOGRAM  01/30/2014  . COLONOSCOPY  01/30/2014  . INFLUENZA VACCINE  Completed   Patient scheduled physical on 06/25/2017 with Glenard Haring.  Referral placed for mammogram We will call [facility] and get last colonoscopy report.    Cancer Screenings: Lung: Low Dose CT Chest recommended if Age 69-80 years, 30 pack-year currently smoking OR have quit w/in 15years. Patient does qualify. Breast: Up to date on Mammogram? No   Up to date of Bone Density/Dexa? No Colorectal: We are calling to get report from [facility]  Additional Screenings: We will complete at physical appointment Hepatitis B/HIV/Syphillis: Hepatitis C Screening:      Plan:   Patient is scheduled for physical with Glenard Haring 06/25/2017 Patient will call and schedule eye exam We will place referral for patient to have mammogram We will complete Dexa Scan at physical appointment  We will call and get report of last colonoscopy  I have personally reviewed and noted the following in the patient's chart:   . Medical and social history . Use of alcohol, tobacco or illicit drugs  . Current medications and supplements . Functional ability and status . Nutritional status . Physical activity . Advanced directives . List of other physicians . Hospitalizations, surgeries, and ER visits in previous 12 months . Vitals . Screenings to include cognitive, depression, and falls . Referrals and appointments  In addition, I have reviewed and discussed with patient certain preventive protocols, quality metrics, and best practice recommendations. A written personalized care plan for preventive services as well as general preventive health recommendations were provided to patient.     Gareth Morgan, LPN   26/71/2458   I have reviewed and agree with the  above AWV  documentation.   Mary-Margaret Hassell Done, FNP

## 2017-06-25 ENCOUNTER — Other Ambulatory Visit: Payer: Self-pay | Admitting: Physician Assistant

## 2017-06-25 ENCOUNTER — Ambulatory Visit (INDEPENDENT_AMBULATORY_CARE_PROVIDER_SITE_OTHER): Payer: PPO | Admitting: Physician Assistant

## 2017-06-25 ENCOUNTER — Ambulatory Visit: Payer: Medicare Other | Admitting: Physician Assistant

## 2017-06-25 ENCOUNTER — Encounter: Payer: Self-pay | Admitting: Physician Assistant

## 2017-06-25 VITALS — BP 125/73 | HR 75 | Temp 97.2°F | Resp 18 | Ht 61.0 in | Wt 146.6 lb

## 2017-06-25 DIAGNOSIS — G43819 Other migraine, intractable, without status migrainosus: Secondary | ICD-10-CM | POA: Diagnosis not present

## 2017-06-25 DIAGNOSIS — J449 Chronic obstructive pulmonary disease, unspecified: Secondary | ICD-10-CM | POA: Diagnosis not present

## 2017-06-25 DIAGNOSIS — J329 Chronic sinusitis, unspecified: Secondary | ICD-10-CM

## 2017-06-25 DIAGNOSIS — M5136 Other intervertebral disc degeneration, lumbar region: Secondary | ICD-10-CM | POA: Diagnosis not present

## 2017-06-25 DIAGNOSIS — K219 Gastro-esophageal reflux disease without esophagitis: Secondary | ICD-10-CM | POA: Diagnosis not present

## 2017-06-25 MED ORDER — AMOXICILLIN-POT CLAVULANATE 875-125 MG PO TABS: 1.0000 | ORAL_TABLET | Freq: Two times a day (BID) | ORAL | 0 refills | Status: DC

## 2017-06-25 MED ORDER — HYDROCODONE-ACETAMINOPHEN 10-325 MG PO TABS: 1.0000 | ORAL_TABLET | Freq: Four times a day (QID) | ORAL | 0 refills | Status: DC | PRN

## 2017-06-25 MED ORDER — ALBUTEROL SULFATE HFA 108 (90 BASE) MCG/ACT IN AERS: 2.0000 | INHALATION_SPRAY | Freq: Four times a day (QID) | RESPIRATORY_TRACT | 2 refills | Status: DC | PRN

## 2017-06-25 MED ORDER — FLUCONAZOLE 150 MG PO TABS: ORAL_TABLET | ORAL | 0 refills | Status: DC

## 2017-06-25 MED ORDER — TOPIRAMATE 100 MG PO TABS: 100.0000 mg | ORAL_TABLET | Freq: Two times a day (BID) | ORAL | 3 refills | Status: DC

## 2017-06-25 NOTE — Patient Instructions (Signed)
In a few days you may receive a survey in the mail or online from [employer] regarding your visit with us today. Please take a moment to fill this out. Your feedback is very important to our whole office. It can help us better understand your needs as well as improve your experience and satisfaction. Thank you for taking your time to complete it. We care about you.  Shifra Swartzentruber, PA-C  

## 2017-06-26 NOTE — Progress Notes (Signed)
BP 125/73   Pulse 75   Temp (!) 97.2 F (36.2 C) (Oral)   Resp 18   Ht 5\' 1"  (1.549 m)   Wt 146 lb 9.6 oz (66.5 kg)   SpO2 100%   BMI 27.70 kg/m    Subjective:    Patient ID: Susan Ward, female    DOB: 08/23/1965, 52 y.o.   MRN: [id number]  HPI: Susan Ward is a 52 y.o. female presenting on 06/25/2017 for Headache; Ear Pain; Sinusitis; Cough; Back Pain; and Shortness of Breath Patient comes she has had recurrence of this.  We have discussed the possibility of going to ear nose and throat doctor if needed.  She is also having much worse migraines.  They are in free diet.  She has not had much benefit from tension medicine.  He even her treatment is not doing very well.  We will make this referral.  She is also here for recheck on her COPD and degenerative disc disease.   Past Medical History:  Diagnosis Date  . Depression   . GERD (gastroesophageal reflux disease)   . Hyperlipidemia   . Mood disorder ([place])    Relevant past medical, surgical, family and social history reviewed and updated as indicated. Interim medical history since our last visit reviewed. Allergies and medications reviewed and updated. DATA REVIEWED: CHART IN EPIC  Family History reviewed for pertinent findings.  Review of Systems  Constitutional: Positive for chills and fatigue. Negative for activity change and appetite change.  HENT: Positive for congestion, postnasal drip and sore throat.   Eyes: Negative.   Respiratory: Positive for cough and wheezing.   Cardiovascular: Negative.  Negative for chest pain, palpitations and leg swelling.  Gastrointestinal: Negative.   Genitourinary: Negative.   Musculoskeletal: Negative.   Skin: Negative.   Neurological: Positive for headaches.    Allergies as of 06/25/2017      Reactions   Aspirin Nausea And Vomiting   upset stomach   Oxycodone    Penicillin G Rash   Yeast Infection      Medication List        Accurate as of 06/25/17 11:59 PM. Always use  your most recent med list.          albuterol 108 (90 Base) MCG/ACT inhaler Commonly known as:  PROVENTIL HFA;VENTOLIN HFA Inhale 2 puffs into the lungs every 6 (six) hours as needed for wheezing or shortness of breath.   amoxicillin-clavulanate 875-125 MG tablet Commonly known as:  AUGMENTIN Take 1 tablet by mouth 2 (two) times daily.   atorvastatin 40 MG tablet Commonly known as:  LIPITOR Take 1 tablet (40 mg total) by mouth daily at 6 PM.   DULoxetine 30 MG capsule Commonly known as:  CYMBALTA Take 2 capsules (60 mg total) by mouth 2 (two) times daily.   DULoxetine 60 MG capsule Commonly known as:  CYMBALTA TAKE ONE CAPSULE BY MOUTH TWICE A DAY   fluconazole 150 MG tablet Commonly known as:  DIFLUCAN 1 po q week x 4 weeks   fluticasone 50 MCG/ACT nasal spray Commonly known as:  FLONASE Place 1 spray into both nostrils 2 (two) times daily.   HYDROcodone-acetaminophen 10-325 MG tablet Commonly known as:  NORCO Take 1 tablet by mouth every 6 (six) hours as needed.   HYDROcodone-acetaminophen 10-325 MG tablet Commonly known as:  NORCO Take 1 tablet by mouth every 6 (six) hours as needed.   HYDROcodone-acetaminophen 10-325 MG tablet Commonly known as:  NORCO  Take 1 tablet by mouth every 6 (six) hours as needed.   linaclotide 145 MCG Caps capsule Commonly known as:  LINZESS Take 145 mcg by mouth daily before breakfast.   omeprazole 40 MG capsule Commonly known as:  PRILOSEC Take 1 capsule (40 mg total) by mouth daily.   topiramate 100 MG tablet Commonly known as:  TOPAMAX Take 1 tablet (100 mg total) by mouth 2 (two) times daily.   vitamin C 100 MG tablet Take 100 mg by mouth daily.   vitamin E 100 UNIT capsule Take by mouth daily.          Objective:    BP 125/73   Pulse 75   Temp (!) 97.2 F (36.2 C) (Oral)   Resp 18   Ht 5\' 1"  (1.549 m)   Wt 146 lb 9.6 oz (66.5 kg)   SpO2 100%   BMI 27.70 kg/m   Allergies  Allergen Reactions  . Aspirin  Nausea And Vomiting    upset stomach  . Oxycodone   . Penicillin G Rash    Yeast Infection    Wt Readings from Last 3 Encounters:  06/25/17 146 lb 9.6 oz (66.5 kg)  05/15/17 147 lb 6.4 oz (66.9 kg)  03/31/17 145 lb (65.8 kg)    Physical Exam  Constitutional: She is oriented to person, place, and time. She appears well-developed and well-nourished.  HENT:  Head: Normocephalic and atraumatic.  Right Ear: Tympanic membrane and external ear normal. No middle ear effusion.  Left Ear: Tympanic membrane and external ear normal.  No middle ear effusion.  Nose: Mucosal edema and rhinorrhea present. Right sinus exhibits no maxillary sinus tenderness. Left sinus exhibits no maxillary sinus tenderness.  Mouth/Throat: Uvula is midline. Posterior oropharyngeal erythema present.  Eyes: Conjunctivae and EOM are normal. Pupils are equal, round, and reactive to light. Right eye exhibits no discharge. Left eye exhibits no discharge.  Neck: Normal range of motion.  Cardiovascular: Normal rate, regular rhythm and normal heart sounds.  Pulmonary/Chest: Effort normal and breath sounds normal. No respiratory distress. She has no wheezes.  Abdominal: Soft.  Lymphadenopathy:    She has no cervical adenopathy.  Neurological: She is alert and oriented to person, place, and time.  Skin: Skin is warm and dry.  Psychiatric: She has a normal mood and affect.  Nursing note and vitals reviewed.       Assessment & Plan:   1. Other migraine without status migrainosus, intractable - topiramate (TOPAMAX) 100 MG tablet; Take 1 tablet (100 mg total) by mouth 2 (two) times daily.  Dispense: 180 tablet; Refill: 3 - AMB referral to headache clinic  2. Chronic sinusitis, unspecified location - amoxicillin-clavulanate (AUGMENTIN) 875-125 MG tablet; Take 1 tablet by mouth 2 (two) times daily.  Dispense: 56 tablet; Refill: 0 - fluconazole (DIFLUCAN) 150 MG tablet; 1 po q week x 4 weeks  Dispense: 4 tablet; Refill:  0  3. Gastroesophageal reflux disease without esophagitis  4. DDD (degenerative disc disease), lumbar - HYDROcodone-acetaminophen (NORCO) 10-325 MG tablet; Take 1 tablet by mouth every 6 (six) hours as needed.  Dispense: 120 tablet; Refill: 0 - HYDROcodone-acetaminophen (NORCO) 10-325 MG tablet; Take 1 tablet by mouth every 6 (six) hours as needed.  Dispense: 120 tablet; Refill: 0 - HYDROcodone-acetaminophen (NORCO) 10-325 MG tablet; Take 1 tablet by mouth every 6 (six) hours as needed.  Dispense: 120 tablet; Refill: 0  5. Chronic obstructive pulmonary disease, unspecified COPD type (HCC) - albuterol (PROVENTIL HFA;VENTOLIN HFA) 108 (90  Base) MCG/ACT inhaler; Inhale 2 puffs into the lungs every 6 (six) hours as needed for wheezing or shortness of breath.  Dispense: 1 Inhaler; Refill: 2   Continue all other maintenance medications as listed above.  Follow up plan: Return in about 2 months (around 08/23/2017) for well and DEXA.  Educational handout given for [place] PA-C [place] [address]  [place], [place] [address] [phone]   06/26/2017, 9:03 PM

## 2017-06-27 ENCOUNTER — Telehealth: Payer: Self-pay | Admitting: Physician Assistant

## 2017-06-27 MED ORDER — CEFDINIR 300 MG PO CAPS: 300.0000 mg | ORAL_CAPSULE | Freq: Two times a day (BID) | ORAL | 0 refills | Status: DC

## 2017-06-27 MED ORDER — ONDANSETRON HCL 4 MG PO TABS: 4.0000 mg | ORAL_TABLET | Freq: Three times a day (TID) | ORAL | 0 refills | Status: DC | PRN

## 2017-06-27 NOTE — Telephone Encounter (Signed)
Pt was in here on 2/6 and was given Augmentin and Diflucan. Pt states she has been N&V with diarrhea ever since she took them meds, Denies fever. Pt cant keep anything down. She is not staying hydrated because she cant keep anything down. Pt has stopped both meds. Please advise. [facility] [place].

## 2017-06-27 NOTE — Telephone Encounter (Signed)
Please call [phone]

## 2017-06-27 NOTE — Telephone Encounter (Signed)
Covering for PCP  Her nausea and vomiting could represent a medication reaction, discontinue Augmentin, trial of Omnicef.  Also given Zofran for nausea.  Laroy Apple, MD [facility] 06/27/2017, 4:16 PM

## 2017-07-15 DIAGNOSIS — H2513 Age-related nuclear cataract, bilateral: Secondary | ICD-10-CM | POA: Diagnosis not present

## 2017-07-15 DIAGNOSIS — H40033 Anatomical narrow angle, bilateral: Secondary | ICD-10-CM | POA: Diagnosis not present

## 2017-08-26 ENCOUNTER — Ambulatory Visit (INDEPENDENT_AMBULATORY_CARE_PROVIDER_SITE_OTHER): Payer: PPO | Admitting: Physician Assistant

## 2017-08-26 ENCOUNTER — Encounter: Payer: Self-pay | Admitting: Physician Assistant

## 2017-08-26 VITALS — BP 139/77 | HR 71 | Temp 97.1°F | Ht 61.0 in | Wt 148.0 lb

## 2017-08-26 DIAGNOSIS — Z0000 Encounter for general adult medical examination without abnormal findings: Secondary | ICD-10-CM

## 2017-08-26 DIAGNOSIS — J0100 Acute maxillary sinusitis, unspecified: Secondary | ICD-10-CM

## 2017-08-26 DIAGNOSIS — Z1239 Encounter for other screening for malignant neoplasm of breast: Secondary | ICD-10-CM

## 2017-08-26 DIAGNOSIS — N951 Menopausal and female climacteric states: Secondary | ICD-10-CM

## 2017-08-26 DIAGNOSIS — L509 Urticaria, unspecified: Secondary | ICD-10-CM

## 2017-08-26 DIAGNOSIS — Z1211 Encounter for screening for malignant neoplasm of colon: Secondary | ICD-10-CM

## 2017-08-26 MED ORDER — DOXYCYCLINE HYCLATE 100 MG PO TABS
100.0000 mg | ORAL_TABLET | Freq: Two times a day (BID) | ORAL | 0 refills | Status: DC
Start: 2017-08-26 — End: 2017-09-30

## 2017-08-26 MED ORDER — CETIRIZINE HCL 10 MG PO TABS: 10.0000 mg | ORAL_TABLET | Freq: Every day | ORAL | 3 refills | Status: DC

## 2017-08-26 MED ORDER — FAMOTIDINE 40 MG PO TABS: 40.0000 mg | ORAL_TABLET | Freq: Every day | ORAL | 3 refills | Status: DC

## 2017-08-26 NOTE — Patient Instructions (Signed)
Hives Hives (urticaria) are itchy, red, swollen areas on your skin. Hives can show up on any part of your body, and they can vary in size. They can be as small as the tip of a pen or much larger. Hives often fade within 24 hours (acute hives). In other cases, new hives show up after old ones fade. This can continue for many days or weeks (chronic hives). Hives are caused by your body's reaction to an irritant or to something that you are allergic to (trigger). You can get hives right after being around a trigger or hours later. Hives do not spread from person to person (are not contagious). Hives may get worse if you scratch them, if you exercise, or if you have worries (emotional stress). Follow these instructions at home: Medicines  Take or apply over-the-counter and prescription medicines only as told by your doctor.  If you were prescribed an antibiotic medicine, use it as told by your doctor. Do not stop taking the antibiotic even if you start to feel better. Skin Care  Apply cool, wet cloths (cool compresses) to the itchy, red, swollen areas.  Do not scratch your skin. Do not rub your skin. General instructions  Do not take hot showers or baths. This can make itching worse.  Do not wear tight clothes.  Use sunscreen and wear clothing that covers your skin when you are outside.  Avoid any triggers that cause your hives. Keep a journal to help you keep track of what causes your hives. Write down: ? What medicines you take. ? What you eat and drink. ? What products you use on your skin.  Keep all follow-up visits as told by your doctor. This is important. Contact a doctor if:  Your symptoms are not better with medicine.  Your joints are painful or swollen. Get help right away if:  You have a fever.  You have belly pain.  Your tongue or lips are swollen.  Your eyelids are swollen.  Your chest or throat feels tight.  You have trouble breathing or swallowing. These  symptoms may be an emergency. Do not wait to see if the symptoms will go away. Get medical help right away. Call your local emergency services (911 in the [place] Do not drive yourself to the hospital. This information is not intended to replace advice given to you by your health care provider. Make sure you discuss any questions you have with your health care provider. Document Released: 02/13/2008 Document Revised: 10/12/2015 Document Reviewed: 02/22/2015 Elsevier Interactive Patient Education  2018 [employer]  

## 2017-08-27 LAB — LIPID PANEL
Chol/HDL Ratio: 4 ratio (ref 0.0–4.4)
Cholesterol, Total: 170 mg/dL (ref 100–199)
HDL: 43 mg/dL (ref >39)
LDL Calculated: 106 mg/dL — ABNORMAL HIGH (ref 0–99)
Triglycerides: 107 mg/dL (ref 0–149)
VLDL Cholesterol Cal: 21 mg/dL (ref 5–40)

## 2017-08-27 LAB — CMP14+EGFR
ALT: 14 IU/L (ref 0–32)
AST: 16 IU/L (ref 0–40)
Albumin/Globulin Ratio: 1.8 (ref 1.2–2.2)
Albumin: 4.2 g/dL (ref 3.5–5.5)
Alkaline Phosphatase: 64 IU/L (ref 39–117)
BUN/Creatinine Ratio: 21 (ref 9–23)
BUN: 14 mg/dL (ref 6–24)
Bilirubin Total: <0.2 mg/dL (ref 0.0–1.2)
CO2: 20 mmol/L (ref 20–29)
Calcium: 9.4 mg/dL (ref 8.7–10.2)
Chloride: 110 mmol/L — ABNORMAL HIGH (ref 96–106)
Creatinine, Ser: 0.67 mg/dL (ref 0.57–1.00)
GFR calc Af Amer: 116 mL/min/{1.73_m2} (ref >59)
GFR calc non Af Amer: 101 mL/min/{1.73_m2} (ref >59)
Globulin, Total: 2.4 g/dL (ref 1.5–4.5)
Glucose: 98 mg/dL (ref 65–99)
Potassium: 4.2 mmol/L (ref 3.5–5.2)
Sodium: 144 mmol/L (ref 134–144)
Total Protein: 6.6 g/dL (ref 6.0–8.5)

## 2017-08-27 LAB — CBC WITH DIFFERENTIAL/PLATELET
Basophils Absolute: 0 10*3/uL (ref 0.0–0.2)
Basos: 0 %
EOS (ABSOLUTE): 0.3 10*3/uL (ref 0.0–0.4)
Eos: 4 %
Hematocrit: 41.9 % (ref 34.0–46.6)
Hemoglobin: 14 g/dL (ref 11.1–15.9)
Immature Grans (Abs): 0 10*3/uL (ref 0.0–0.1)
Immature Granulocytes: 0 %
Lymphocytes Absolute: 3.1 10*3/uL (ref 0.7–3.1)
Lymphs: 34 %
MCH: 30.7 pg (ref 26.6–33.0)
MCHC: 33.4 g/dL (ref 31.5–35.7)
MCV: 92 fL (ref 79–97)
Monocytes Absolute: 0.5 10*3/uL (ref 0.1–0.9)
Monocytes: 6 %
Neutrophils Absolute: 5 10*3/uL (ref 1.4–7.0)
Neutrophils: 56 %
Platelets: 217 10*3/uL (ref 150–379)
RBC: 4.56 x10E6/uL (ref 3.77–5.28)
RDW: 13 % (ref 12.3–15.4)
WBC: 9 10*3/uL (ref 3.4–10.8)

## 2017-08-27 LAB — TSH: TSH: 1.32 u[IU]/mL (ref 0.450–4.500)

## 2017-08-27 NOTE — Progress Notes (Signed)
   BP 139/77   Pulse 71   Temp (!) 97.1 F (36.2 C) (Oral)   Ht 5' 1" (1.549 m)   Wt 148 lb (67.1 kg)   BMI 27.96 kg/m    Subjective:    Patient ID: Susan Ward, female    DOB: 04/10/1966, 51 y.o.   MRN: [id number]  HPI: Susan Ward is a 51 y.o. female presenting on 08/26/2017 for Annual Exam  Patient comes in for her annual checkup.  She is having significant amount of issues.  She is developing a sinus infection again.  She has had problems with these over the past few months.  She had stopped Augmentin due to a side effect.  However the Omnicef made her feel extremely bad.  She is willing to try the Augmentin again.  She is asked about breast cancer and colon cancer screening.  We will set her up for a colonoscopy in [place] with a gastroenterologist.  She will also be having her mammogram here.  She had a hysterectomy for dysfunctional uterine bleeding many years ago.  She does have a family history of gallbladder cancer in some siblings and mother otherwise there are scattered lung, colon, breast cancers.  One last complaint is significant itching across her back it is very severe at times.  It does not occur on any other part of her body.  There is not a rash associated with it but it will create severe itching.  No over-the-counter antihistamine has helped.  She also has some changes on her face and her nose and left cheek where she has a darkening of some lesions that have been there for many years she remembers them being there as early as age 22.  We will plan dermatology evaluation for that.  Past Medical History:  Diagnosis Date  . Depression   . GERD (gastroesophageal reflux disease)   . Hyperlipidemia   . Mood disorder (HCC)    Relevant past medical, surgical, family and social history reviewed and updated as indicated. Interim medical history since our last visit reviewed. Allergies and medications reviewed and updated. DATA REVIEWED: CHART IN EPIC  Family History  reviewed for pertinent findings.  Review of Systems  Constitutional: Negative.  Negative for activity change, fatigue and fever.  HENT: Negative.   Eyes: Negative.   Respiratory: Negative.  Negative for cough.   Cardiovascular: Negative.  Negative for chest pain.  Gastrointestinal: Negative.  Negative for abdominal pain.  Endocrine: Negative.   Genitourinary: Negative.  Negative for dysuria.  Musculoskeletal: Negative.   Skin: Negative.  Negative for color change and pallor.  Neurological: Negative.     Allergies as of 08/26/2017      Reactions   Aspirin Nausea And Vomiting   upset stomach   Oxycodone    Penicillin G Rash   Yeast Infection      Medication List        Accurate as of 08/26/17 11:59 PM. Always use your most recent med list.          albuterol 108 (90 Base) MCG/ACT inhaler Commonly known as:  PROVENTIL HFA;VENTOLIN HFA Inhale 2 puffs into the lungs every 6 (six) hours as needed for wheezing or shortness of breath.   atorvastatin 40 MG tablet Commonly known as:  LIPITOR Take 1 tablet (40 mg total) by mouth daily at 6 PM.   cetirizine 10 MG tablet Commonly known as:  ZYRTEC Take 1 tablet (10 mg total) by mouth daily.     doxycycline 100 MG tablet Commonly known as:  VIBRA-TABS Take 1 tablet (100 mg total) by mouth 2 (two) times daily.   DULoxetine 30 MG capsule Commonly known as:  CYMBALTA Take 2 capsules (60 mg total) by mouth 2 (two) times daily.   DULoxetine 60 MG capsule Commonly known as:  CYMBALTA TAKE ONE CAPSULE BY MOUTH TWICE A DAY   famotidine 40 MG tablet Commonly known as:  PEPCID Take 1 tablet (40 mg total) by mouth daily.   fluconazole 150 MG tablet Commonly known as:  DIFLUCAN 1 po q week x 4 weeks   fluticasone 50 MCG/ACT nasal spray Commonly known as:  FLONASE Place 1 spray into both nostrils 2 (two) times daily.   HYDROcodone-acetaminophen 10-325 MG tablet Commonly known as:  NORCO Take 1 tablet by mouth every 6 (six) hours  as needed.   HYDROcodone-acetaminophen 10-325 MG tablet Commonly known as:  NORCO Take 1 tablet by mouth every 6 (six) hours as needed.   HYDROcodone-acetaminophen 10-325 MG tablet Commonly known as:  NORCO Take 1 tablet by mouth every 6 (six) hours as needed.   linaclotide 145 MCG Caps capsule Commonly known as:  LINZESS Take 145 mcg by mouth daily before breakfast.   omeprazole 40 MG capsule Commonly known as:  PRILOSEC Take 1 capsule (40 mg total) by mouth daily.   topiramate 100 MG tablet Commonly known as:  TOPAMAX Take 1 tablet (100 mg total) by mouth 2 (two) times daily.   vitamin C 100 MG tablet Take 100 mg by mouth daily.   vitamin E 100 UNIT capsule Take by mouth daily.          Objective:    BP 139/77   Pulse 71   Temp (!) 97.1 F (36.2 C) (Oral)   Ht 5' 1" (1.549 m)   Wt 148 lb (67.1 kg)   BMI 27.96 kg/m   Allergies  Allergen Reactions  . Aspirin Nausea And Vomiting    upset stomach  . Oxycodone   . Penicillin G Rash    Yeast Infection    Wt Readings from Last 3 Encounters:  08/26/17 148 lb (67.1 kg)  06/25/17 146 lb 9.6 oz (66.5 kg)  05/15/17 147 lb 6.4 oz (66.9 kg)    Physical Exam  Constitutional: She is oriented to person, place, and time. She appears well-developed and well-nourished.  HENT:  Head: Normocephalic and atraumatic.  Eyes: Pupils are equal, round, and reactive to light. Conjunctivae and EOM are normal.  Neck: Normal range of motion. Neck supple.  Cardiovascular: Normal rate, regular rhythm, normal heart sounds and intact distal pulses.  Pulmonary/Chest: Effort normal and breath sounds normal. Right breast exhibits no mass, no skin change and no tenderness. Left breast exhibits no mass, no skin change and no tenderness. No breast tenderness, discharge or bleeding. Breasts are symmetrical.  Abdominal: Soft. Bowel sounds are normal.  Genitourinary: Vagina normal. Rectal exam shows no fissure. No breast tenderness, discharge or  bleeding. There is no tenderness or lesion on the right labia. There is no tenderness or lesion on the left labia. Right adnexum displays no mass, no tenderness and no fullness. Left adnexum displays no mass, no tenderness and no fullness. No tenderness or bleeding in the vagina. No vaginal discharge found.  Neurological: She is alert and oriented to person, place, and time. She has normal reflexes.  Skin: Skin is warm and dry. No rash noted.  Psychiatric: She has a normal mood and affect. Her behavior is  normal. Judgment and thought content normal.    Results for orders placed or performed in visit on 08/26/17  CBC with Differential/Platelet  Result Value Ref Range   WBC 9.0 3.4 - 10.8 x10E3/uL   RBC 4.56 3.77 - 5.28 x10E6/uL   Hemoglobin 14.0 11.1 - 15.9 g/dL   Hematocrit 41.9 34.0 - 46.6 %   MCV 92 79 - 97 fL   MCH 30.7 26.6 - 33.0 pg   MCHC 33.4 31.5 - 35.7 g/dL   RDW 13.0 12.3 - 15.4 %   Platelets 217 150 - 379 x10E3/uL   Neutrophils 56 Not Estab. %   Lymphs 34 Not Estab. %   Monocytes 6 Not Estab. %   Eos 4 Not Estab. %   Basos 0 Not Estab. %   Neutrophils Absolute 5.0 1.4 - 7.0 x10E3/uL   Lymphocytes Absolute 3.1 0.7 - 3.1 x10E3/uL   Monocytes Absolute 0.5 0.1 - 0.9 x10E3/uL   EOS (ABSOLUTE) 0.3 0.0 - 0.4 x10E3/uL   Basophils Absolute 0.0 0.0 - 0.2 x10E3/uL   Immature Granulocytes 0 Not Estab. %   Immature Grans (Abs) 0.0 0.0 - 0.1 x10E3/uL  CMP14+EGFR  Result Value Ref Range   Glucose 98 65 - 99 mg/dL   BUN 14 6 - 24 mg/dL   Creatinine, Ser 0.67 0.57 - 1.00 mg/dL   GFR calc non Af Amer 101 >59 mL/min/1.73   GFR calc Af Amer 116 >59 mL/min/1.73   BUN/Creatinine Ratio 21 9 - 23   Sodium 144 134 - 144 mmol/L   Potassium 4.2 3.5 - 5.2 mmol/L   Chloride 110 (H) 96 - 106 mmol/L   CO2 20 20 - 29 mmol/L   Calcium 9.4 8.7 - 10.2 mg/dL   Total Protein 6.6 6.0 - 8.5 g/dL   Albumin 4.2 3.5 - 5.5 g/dL   Globulin, Total 2.4 1.5 - 4.5 g/dL   Albumin/Globulin Ratio 1.8 1.2 - 2.2    Bilirubin Total <0.2 0.0 - 1.2 mg/dL   Alkaline Phosphatase 64 39 - 117 IU/L   AST 16 0 - 40 IU/L   ALT 14 0 - 32 IU/L  Lipid panel  Result Value Ref Range   Cholesterol, Total 170 100 - 199 mg/dL   Triglycerides 107 0 - 149 mg/dL   HDL 43 >39 mg/dL   VLDL Cholesterol Cal 21 5 - 40 mg/dL   LDL Calculated 106 (H) 0 - 99 mg/dL   Chol/HDL Ratio 4.0 0.0 - 4.4 ratio  TSH  Result Value Ref Range   TSH 1.320 0.450 - 4.500 uIU/mL      Assessment & Plan:   1. Well adult exam - CBC with Differential/Platelet - CMP14+EGFR - Lipid panel - TSH - MM Digital Diagnostic Bilat; Future  2. Urticaria - cetirizine (ZYRTEC) 10 MG tablet; Take 1 tablet (10 mg total) by mouth daily.  Dispense: 90 tablet; Refill: 3 - famotidine (PEPCID) 40 MG tablet; Take 1 tablet (40 mg total) by mouth daily.  Dispense: 90 tablet; Refill: 3 - Ambulatory referral to Dermatology  3. Perimenopausal  4. Screening for breast cancer - MM Digital Diagnostic Bilat; Future  5. Screening for malignant neoplasm of colon - Ambulatory referral to Gastroenterology  6. Acute non-recurrent maxillary sinusitis - doxycycline (VIBRA-TABS) 100 MG tablet; Take 1 tablet (100 mg total) by mouth 2 (two) times daily.  Dispense: 20 tablet; Refill: 0   Continue all other maintenance medications as listed above.  Follow up plan: Keep regular follow up  Educational handout given for survey  Angel S. Jones PA-C [facility] [address]  [place], [place] [address] [phone]   08/27/2017, 1:48 PM 

## 2017-08-29 ENCOUNTER — Encounter: Payer: Self-pay | Admitting: Internal Medicine

## 2017-09-02 DIAGNOSIS — L819 Disorder of pigmentation, unspecified: Secondary | ICD-10-CM | POA: Diagnosis not present

## 2017-09-02 DIAGNOSIS — L299 Pruritus, unspecified: Secondary | ICD-10-CM | POA: Diagnosis not present

## 2017-09-02 DIAGNOSIS — L570 Actinic keratosis: Secondary | ICD-10-CM | POA: Diagnosis not present

## 2017-09-11 DIAGNOSIS — H40003 Preglaucoma, unspecified, bilateral: Secondary | ICD-10-CM | POA: Diagnosis not present

## 2017-09-22 ENCOUNTER — Other Ambulatory Visit: Payer: Self-pay | Admitting: Physician Assistant

## 2017-09-22 DIAGNOSIS — J449 Chronic obstructive pulmonary disease, unspecified: Secondary | ICD-10-CM

## 2017-09-22 MED ORDER — ALBUTEROL SULFATE HFA 108 (90 BASE) MCG/ACT IN AERS: 2.0000 | INHALATION_SPRAY | Freq: Four times a day (QID) | RESPIRATORY_TRACT | 5 refills | Status: DC | PRN

## 2017-09-26 ENCOUNTER — Other Ambulatory Visit: Payer: Self-pay | Admitting: Physician Assistant

## 2017-09-26 DIAGNOSIS — M5136 Other intervertebral disc degeneration, lumbar region: Secondary | ICD-10-CM

## 2017-09-26 DIAGNOSIS — G43819 Other migraine, intractable, without status migrainosus: Secondary | ICD-10-CM

## 2017-09-26 DIAGNOSIS — K219 Gastro-esophageal reflux disease without esophagitis: Secondary | ICD-10-CM

## 2017-09-30 ENCOUNTER — Ambulatory Visit (INDEPENDENT_AMBULATORY_CARE_PROVIDER_SITE_OTHER): Payer: PPO | Admitting: Physician Assistant

## 2017-09-30 ENCOUNTER — Encounter: Payer: Self-pay | Admitting: Physician Assistant

## 2017-09-30 VITALS — BP 132/78 | HR 79 | Temp 97.9°F | Ht 61.0 in | Wt 150.0 lb

## 2017-09-30 DIAGNOSIS — M5136 Other intervertebral disc degeneration, lumbar region: Secondary | ICD-10-CM

## 2017-09-30 DIAGNOSIS — L03221 Cellulitis of neck: Secondary | ICD-10-CM

## 2017-09-30 DIAGNOSIS — E7800 Pure hypercholesterolemia, unspecified: Secondary | ICD-10-CM

## 2017-09-30 MED ORDER — HYDROCODONE-ACETAMINOPHEN 10-325 MG PO TABS: 1.0000 | ORAL_TABLET | Freq: Four times a day (QID) | ORAL | 0 refills | Status: DC | PRN

## 2017-09-30 MED ORDER — CEPHALEXIN 500 MG PO CAPS: 500.0000 mg | ORAL_CAPSULE | Freq: Four times a day (QID) | ORAL | 0 refills | Status: DC

## 2017-09-30 MED ORDER — ATORVASTATIN CALCIUM 40 MG PO TABS: 40.0000 mg | ORAL_TABLET | Freq: Every day | ORAL | 11 refills | Status: DC

## 2017-09-30 MED ORDER — PREDNISONE 10 MG (21) PO TBPK: ORAL_TABLET | ORAL | 0 refills | Status: DC

## 2017-09-30 NOTE — Progress Notes (Signed)
BP 132/78   Pulse 79   Temp 97.9 F (36.6 C) (Oral)   Ht '5\' 1"'  (1.549 m)   Wt 150 lb (68 kg)   BMI 28.34 kg/m    Subjective:    Patient ID: Susan Ward, female    DOB: 17-May-1966, 52 y.o.   MRN: [id number]  HPI: Susan Ward is a 52 y.o. female presenting on 09/30/2017 for No chief complaint on file.  This patient comes in for 63-monthfollow-up on her pain.  Overall she is doing very well with that medication. Chronic pain is under control, patient has no new complaints. Medications are keeping things stable. Needs refills for the next three months.  [place] Controlled Substance website checked and normal. Drug screen normal this year. She also is having right-sided pain under her jawline.  It hurts down into her neck.  And up to her ear.  She has had lots of sinus infections in the past and has had a significant amount of allergies this spring.  She denies any fever or chills at this time.  Past Medical History:  Diagnosis Date  . Depression   . GERD (gastroesophageal reflux disease)   . Hyperlipidemia   . Mood disorder ([place]    Relevant past medical, surgical, family and social history reviewed and updated as indicated. Interim medical history since our last visit reviewed. Allergies and medications reviewed and updated. DATA REVIEWED: CHART IN EPIC  Family History reviewed for pertinent findings.  Review of Systems  Constitutional: Negative.  Negative for activity change, fatigue and fever.  HENT: Positive for ear pain and facial swelling. Negative for trouble swallowing and voice change.   Eyes: Negative.   Respiratory: Negative.  Negative for cough.   Cardiovascular: Negative.  Negative for chest pain.  Gastrointestinal: Negative.  Negative for abdominal pain.  Endocrine: Negative.   Genitourinary: Negative.  Negative for dysuria.  Musculoskeletal: Negative.   Skin: Negative.   Neurological: Negative.     Allergies as of 09/30/2017      Reactions   Aspirin Nausea  And Vomiting   upset stomach   Oxycodone    Penicillin G Rash   Yeast Infection      Medication List        Accurate as of 09/30/17 11:43 AM. Always use your most recent med list.          albuterol 108 (90 Base) MCG/ACT inhaler Commonly known as:  PROVENTIL HFA;VENTOLIN HFA Inhale 2 puffs into the lungs every 6 (six) hours as needed for wheezing or shortness of breath.   atorvastatin 40 MG tablet Commonly known as:  LIPITOR Take 1 tablet (40 mg total) by mouth daily at 6 PM.   cephALEXin 500 MG capsule Commonly known as:  KEFLEX Take 1 capsule (500 mg total) by mouth 4 (four) times daily.   cetirizine 10 MG tablet Commonly known as:  ZYRTEC Take 1 tablet (10 mg total) by mouth daily.   DULoxetine 60 MG capsule Commonly known as:  CYMBALTA Take 60 mg by mouth 2 (two) times daily.   famotidine 40 MG tablet Commonly known as:  PEPCID Take 1 tablet (40 mg total) by mouth daily.   fluticasone 50 MCG/ACT nasal spray Commonly known as:  FLONASE Place 1 spray into both nostrils 2 (two) times daily.   HYDROcodone-acetaminophen 10-325 MG tablet Commonly known as:  NORCO Take 1 tablet by mouth every 6 (six) hours as needed.   HYDROcodone-acetaminophen 10-325 MG tablet Commonly known  as:  NORCO Take 1 tablet by mouth every 6 (six) hours as needed.   HYDROcodone-acetaminophen 10-325 MG tablet Commonly known as:  NORCO Take 1 tablet by mouth every 6 (six) hours as needed.   linaclotide 145 MCG Caps capsule Commonly known as:  LINZESS Take 145 mcg by mouth daily before breakfast.   omeprazole 40 MG capsule Commonly known as:  PRILOSEC TAKE ONE (1) CAPSULE EACH DAY   predniSONE 10 MG (21) Tbpk tablet Commonly known as:  STERAPRED UNI-PAK 21 TAB As directed x 6 days   topiramate 100 MG tablet Commonly known as:  TOPAMAX TAKE ONE TABLET BY MOUTH TWICE DAILY   vitamin C 100 MG tablet Take 100 mg by mouth daily.   vitamin E 100 UNIT capsule Take by mouth  daily.          Objective:    BP 132/78   Pulse 79   Temp 97.9 F (36.6 C) (Oral)   Ht '5\' 1"'  (1.549 m)   Wt 150 lb (68 kg)   BMI 28.34 kg/m   Allergies  Allergen Reactions  . Aspirin Nausea And Vomiting    upset stomach  . Oxycodone   . Penicillin G Rash    Yeast Infection    Wt Readings from Last 3 Encounters:  09/30/17 150 lb (68 kg)  08/26/17 148 lb (67.1 kg)  06/25/17 146 lb 9.6 oz (66.5 kg)    Physical Exam  Constitutional: She is oriented to person, place, and time. She appears well-developed and well-nourished.  HENT:  Head: Normocephalic and atraumatic.    Swelling and tenderness on the right submandibular area  Eyes: Pupils are equal, round, and reactive to light. Conjunctivae and EOM are normal.  Cardiovascular: Normal rate, regular rhythm, normal heart sounds and intact distal pulses.  Pulmonary/Chest: Effort normal and breath sounds normal.  Abdominal: Soft. Bowel sounds are normal.  Neurological: She is alert and oriented to person, place, and time. She has normal reflexes.  Skin: Skin is warm and dry. No rash noted.  Psychiatric: She has a normal mood and affect. Her behavior is normal. Judgment and thought content normal.    Results for orders placed or performed in visit on 08/26/17  CBC with Differential/Platelet  Result Value Ref Range   WBC 9.0 3.4 - 10.8 x10E3/uL   RBC 4.56 3.77 - 5.28 x10E6/uL   Hemoglobin 14.0 11.1 - 15.9 g/dL   Hematocrit 41.9 34.0 - 46.6 %   MCV 92 79 - 97 fL   MCH 30.7 26.6 - 33.0 pg   MCHC 33.4 31.5 - 35.7 g/dL   RDW 13.0 12.3 - 15.4 %   Platelets 217 150 - 379 x10E3/uL   Neutrophils 56 Not Estab. %   Lymphs 34 Not Estab. %   Monocytes 6 Not Estab. %   Eos 4 Not Estab. %   Basos 0 Not Estab. %   Neutrophils Absolute 5.0 1.4 - 7.0 x10E3/uL   Lymphocytes Absolute 3.1 0.7 - 3.1 x10E3/uL   Monocytes Absolute 0.5 0.1 - 0.9 x10E3/uL   EOS (ABSOLUTE) 0.3 0.0 - 0.4 x10E3/uL   Basophils Absolute 0.0 0.0 - 0.2  x10E3/uL   Immature Granulocytes 0 Not Estab. %   Immature Grans (Abs) 0.0 0.0 - 0.1 x10E3/uL  CMP14+EGFR  Result Value Ref Range   Glucose 98 65 - 99 mg/dL   BUN 14 6 - 24 mg/dL   Creatinine, Ser 0.67 0.57 - 1.00 mg/dL   GFR calc non Af [place] Lora  101 >59 mL/min/1.73   GFR calc Af Amer 116 >59 mL/min/1.73   BUN/Creatinine Ratio 21 9 - 23   Sodium 144 134 - 144 mmol/L   Potassium 4.2 3.5 - 5.2 mmol/L   Chloride 110 (H) 96 - 106 mmol/L   CO2 20 20 - 29 mmol/L   Calcium 9.4 8.7 - 10.2 mg/dL   Total Protein 6.6 6.0 - 8.5 g/dL   Albumin 4.2 3.5 - 5.5 g/dL   Globulin, Total 2.4 1.5 - 4.5 g/dL   Albumin/Globulin Ratio 1.8 1.2 - 2.2   Bilirubin Total <0.2 0.0 - 1.2 mg/dL   Alkaline Phosphatase 64 39 - 117 IU/L   AST 16 0 - 40 IU/L   ALT 14 0 - 32 IU/L  Lipid panel  Result Value Ref Range   Cholesterol, Total 170 100 - 199 mg/dL   Triglycerides 107 0 - 149 mg/dL   HDL 43 >39 mg/dL   VLDL Cholesterol Cal 21 5 - 40 mg/dL   LDL Calculated 106 (H) 0 - 99 mg/dL   Chol/HDL Ratio 4.0 0.0 - 4.4 ratio  TSH  Result Value Ref Range   TSH 1.320 0.450 - 4.500 uIU/mL      Assessment & Plan:   1. DDD (degenerative disc disease), lumbar - HYDROcodone-acetaminophen (NORCO) 10-325 MG tablet; Take 1 tablet by mouth every 6 (six) hours as needed.  Dispense: 120 tablet; Refill: 0 - HYDROcodone-acetaminophen (NORCO) 10-325 MG tablet; Take 1 tablet by mouth every 6 (six) hours as needed.  Dispense: 120 tablet; Refill: 0 - HYDROcodone-acetaminophen (NORCO) 10-325 MG tablet; Take 1 tablet by mouth every 6 (six) hours as needed.  Dispense: 120 tablet; Refill: 0  2. Pure hypercholesterolemia - atorvastatin (LIPITOR) 40 MG tablet; Take 1 tablet (40 mg total) by mouth daily at 6 PM.  Dispense: 30 tablet; Refill: 11  3. Cellulitis of neck - predniSONE (STERAPRED UNI-PAK 21 TAB) 10 MG (21) TBPK tablet; As directed x 6 days  Dispense: 21 tablet; Refill: 0 - cephALEXin (KEFLEX) 500 MG capsule; Take 1 capsule (500  mg total) by mouth 4 (four) times daily.  Dispense: 40 capsule; Refill: 0   Continue all other maintenance medications as listed above.  Follow up plan: Return in about 3 months (around 12/31/2017).  Educational handout given for [place] PA-C [facility] [address]  [place], [place] [address] [phone]   09/30/2017, 11:43 AM

## 2017-10-02 DIAGNOSIS — D485 Neoplasm of uncertain behavior of skin: Secondary | ICD-10-CM | POA: Diagnosis not present

## 2017-10-02 DIAGNOSIS — L280 Lichen simplex chronicus: Secondary | ICD-10-CM | POA: Diagnosis not present

## 2017-10-02 DIAGNOSIS — L570 Actinic keratosis: Secondary | ICD-10-CM | POA: Diagnosis not present

## 2017-10-17 ENCOUNTER — Telehealth: Payer: Self-pay | Admitting: Physician Assistant

## 2017-10-17 NOTE — Telephone Encounter (Signed)
Appt was made to get Rx for new machine & supplies since hers is very old

## 2017-10-24 ENCOUNTER — Ambulatory Visit: Payer: PPO | Admitting: Physician Assistant

## 2017-10-27 ENCOUNTER — Encounter: Payer: Self-pay | Admitting: Physician Assistant

## 2017-10-27 ENCOUNTER — Ambulatory Visit (INDEPENDENT_AMBULATORY_CARE_PROVIDER_SITE_OTHER): Payer: PPO | Admitting: Physician Assistant

## 2017-10-27 VITALS — BP 134/77 | HR 75 | Temp 98.3°F | Ht 61.0 in | Wt 152.4 lb

## 2017-10-27 DIAGNOSIS — J449 Chronic obstructive pulmonary disease, unspecified: Secondary | ICD-10-CM

## 2017-10-27 MED ORDER — BUDESONIDE-FORMOTEROL FUMARATE 160-4.5 MCG/ACT IN AERO: 2.0000 | INHALATION_SPRAY | Freq: Two times a day (BID) | RESPIRATORY_TRACT | 3 refills | Status: DC

## 2017-10-27 MED ORDER — ALBUTEROL SULFATE 0.63 MG/3ML IN NEBU
1.0000 | INHALATION_SOLUTION | Freq: Four times a day (QID) | RESPIRATORY_TRACT | 12 refills | Status: DC | PRN
Start: 2017-10-27 — End: 2019-07-05

## 2017-10-27 NOTE — Progress Notes (Signed)
BP 134/77   Pulse 75   Temp 98.3 F (36.8 C) (Oral)   Ht _0  (1.549 m)   Wt 152 lb 6.4 oz (69.1 kg)   SpO2 97%   BMI 28.80 kg/m    Subjective:    Patient ID: Susan Ward, female    DOB: [birth year], 52 y.o.   MRN: [id number]  HPI: Susan Ward is a 52 y.o. female presenting on 10/27/2017 for COPD (needs rx for nebulizer) and Ear Pain (right )  This patient comes in for recheck on her medical conditions.  They do include COPD.  She has had some difficulty with her breathing.  It does not get much worse in the summer months.  She notes that she has been using her albuterol at least a couple times per day.  She is on no controller medication at this time.  She has used a nebulizer in the past but she does not have one this time.  We will get this ordered.  She also has had increased right ear pain and this is bothered her in the past.  Past Medical History:  Diagnosis Date  . Depression   . GERD (gastroesophageal reflux disease)   . Hyperlipidemia   . Mood disorder ([place])    Relevant past medical, surgical, family and social history reviewed and updated as indicated. Interim medical history since our last visit reviewed. Allergies and medications reviewed and updated. DATA REVIEWED: CHART IN EPIC  Family History reviewed for pertinent findings.  Review of Systems  Constitutional: Negative.  Negative for activity change, fatigue and fever.  HENT: Positive for ear pain.   Eyes: Negative.   Respiratory: Positive for cough, shortness of breath and wheezing.   Cardiovascular: Negative.  Negative for chest pain.  Gastrointestinal: Negative.  Negative for abdominal pain.  Endocrine: Negative.   Genitourinary: Negative.  Negative for dysuria.  Musculoskeletal: Negative.   Skin: Negative.   Neurological: Negative.     Allergies as of 10/27/2017      Reactions   Aspirin Nausea And Vomiting   upset stomach   Oxycodone    Penicillin G Rash   Yeast Infection        Medication List        Accurate as of 10/27/17  1:55 PM. Always use your most recent med list.          albuterol 108 (90 Base) MCG/ACT inhaler Commonly known as:  PROVENTIL HFA;VENTOLIN HFA Inhale 2 puffs into the lungs every 6 (six) hours as needed for wheezing or shortness of breath.   albuterol 0.63 MG/3ML nebulizer solution Commonly known as:  ACCUNEB Take 3 mLs (0.63 mg total) by nebulization every 6 (six) hours as needed for wheezing.   atorvastatin 40 MG tablet Commonly known as:  LIPITOR Take 1 tablet (40 mg total) by mouth daily at 6 PM.   budesonide-formoterol 160-4.5 MCG/ACT inhaler Commonly known as:  SYMBICORT Inhale 2 puffs into the lungs 2 (two) times daily.   cetirizine 10 MG tablet Commonly known as:  ZYRTEC Take 1 tablet (10 mg total) by mouth daily.   DULoxetine 60 MG capsule Commonly known as:  CYMBALTA Take 60 mg by mouth 2 (two) times daily.   famotidine 40 MG tablet Commonly known as:  PEPCID Take 1 tablet (40 mg total) by mouth daily.   fluticasone 50 MCG/ACT nasal spray Commonly known as:  FLONASE Place 1 spray into both nostrils 2 (two) times daily.  HYDROcodone-acetaminophen 10-325 MG tablet Commonly known as:  NORCO Take 1 tablet by mouth every 6 (six) hours as needed.   HYDROcodone-acetaminophen 10-325 MG tablet Commonly known as:  NORCO Take 1 tablet by mouth every 6 (six) hours as needed.   HYDROcodone-acetaminophen 10-325 MG tablet Commonly known as:  NORCO Take 1 tablet by mouth every 6 (six) hours as needed.   linaclotide 145 MCG Caps capsule Commonly known as:  LINZESS Take 145 mcg by mouth daily before breakfast.   omeprazole 40 MG capsule Commonly known as:  PRILOSEC TAKE ONE (1) CAPSULE EACH DAY   topiramate 100 MG tablet Commonly known as:  TOPAMAX TAKE ONE TABLET BY MOUTH TWICE DAILY   vitamin C 100 MG tablet Take 100 mg by mouth daily.   vitamin E 100 UNIT capsule Take by mouth daily.             Durable Medical Equipment  (From admission, onward)        Start     Ordered   10/27/17 0000  DME Nebulizer machine    Comments:  DX: J44.9  Question:  Patient needs a nebulizer to treat with the following condition  Answer:  COPD (chronic obstructive pulmonary disease) ([place])   10/27/17 0911         Objective:    BP 134/77   Pulse 75   Temp 98.3 F (36.8 C) (Oral)   Ht _0  (1.549 m)   Wt 152 lb 6.4 oz (69.1 kg)   SpO2 97%   BMI 28.80 kg/m   Allergies  Allergen Reactions  . Aspirin Nausea And Vomiting    upset stomach  . Oxycodone   . Penicillin G Rash    Yeast Infection    Wt Readings from Last 3 Encounters:  10/27/17 152 lb 6.4 oz (69.1 kg)  09/30/17 150 lb (68 kg)  08/26/17 148 lb (67.1 kg)    Physical Exam  Constitutional: She is oriented to person, place, and time. She appears well-developed and well-nourished.  HENT:  Head: Normocephalic and atraumatic.  Right Ear: Tympanic membrane, external ear and ear canal normal.  Left Ear: Tympanic membrane, external ear and ear canal normal.  Nose: Nose normal. No rhinorrhea.  Mouth/Throat: Oropharynx is clear and moist and mucous membranes are normal. No oropharyngeal exudate or posterior oropharyngeal erythema.  Eyes: Pupils are equal, round, and reactive to light. Conjunctivae and EOM are normal.  Neck: Normal range of motion. Neck supple.  Cardiovascular: Normal rate, regular rhythm, normal heart sounds and intact distal pulses.  Pulmonary/Chest: Effort normal and breath sounds normal.  Abdominal: Soft. Bowel sounds are normal.  Neurological: She is alert and oriented to person, place, and time. She has normal reflexes.  Skin: Skin is warm and dry. No rash noted.  Psychiatric: She has a normal mood and affect. Her behavior is normal. Judgment and thought content normal.    Results for orders placed or performed in visit on 08/26/17  CBC with Differential/Platelet  Result Value Ref Range   WBC 9.0 3.4 -  10.8 x10E3/uL   RBC 4.56 3.77 - 5.28 x10E6/uL   Hemoglobin 14.0 11.1 - 15.9 g/dL   Hematocrit 41.9 34.0 - 46.6 %   MCV 92 79 - 97 fL   MCH 30.7 26.6 - 33.0 pg   MCHC 33.4 31.5 - 35.7 g/dL   RDW 13.0 12.3 - 15.4 %   Platelets 217 150 - 379 x10E3/uL   Neutrophils 56 Not Estab. %  Lymphs 34 Not Estab. %   Monocytes 6 Not Estab. %   Eos 4 Not Estab. %   Basos 0 Not Estab. %   Neutrophils Absolute 5.0 1.4 - 7.0 x10E3/uL   Lymphocytes Absolute 3.1 0.7 - 3.1 x10E3/uL   Monocytes Absolute 0.5 0.1 - 0.9 x10E3/uL   EOS (ABSOLUTE) 0.3 0.0 - 0.4 x10E3/uL   Basophils Absolute 0.0 0.0 - 0.2 x10E3/uL   Immature Granulocytes 0 Not Estab. %   Immature Grans (Abs) 0.0 0.0 - 0.1 x10E3/uL  CMP14+EGFR  Result Value Ref Range   Glucose 98 65 - 99 mg/dL   BUN 14 6 - 24 mg/dL   Creatinine, Ser 0.67 0.57 - 1.00 mg/dL   GFR calc non Af Amer 101 >59 mL/min/1.73   GFR calc Af Amer 116 >59 mL/min/1.73   BUN/Creatinine Ratio 21 9 - 23   Sodium 144 134 - 144 mmol/L   Potassium 4.2 3.5 - 5.2 mmol/L   Chloride 110 (H) 96 - 106 mmol/L   CO2 20 20 - 29 mmol/L   Calcium 9.4 8.7 - 10.2 mg/dL   Total Protein 6.6 6.0 - 8.5 g/dL   Albumin 4.2 3.5 - 5.5 g/dL   Globulin, Total 2.4 1.5 - 4.5 g/dL   Albumin/Globulin Ratio 1.8 1.2 - 2.2   Bilirubin Total <0.2 0.0 - 1.2 mg/dL   Alkaline Phosphatase 64 39 - 117 IU/L   AST 16 0 - 40 IU/L   ALT 14 0 - 32 IU/L  Lipid panel  Result Value Ref Range   Cholesterol, Total 170 100 - 199 mg/dL   Triglycerides 107 0 - 149 mg/dL   HDL 43 >39 mg/dL   VLDL Cholesterol Cal 21 5 - 40 mg/dL   LDL Calculated 106 (H) 0 - 99 mg/dL   Chol/HDL Ratio 4.0 0.0 - 4.4 ratio  TSH  Result Value Ref Range   TSH 1.320 0.450 - 4.500 uIU/mL      Assessment & Plan:   1. Chronic obstructive pulmonary disease, unspecified COPD type ([place]) - DME Nebulizer machine - albuterol (ACCUNEB) 0.63 MG/3ML nebulizer solution; Take 3 mLs (0.63 mg total) by nebulization every 6 (six) hours as needed  for wheezing.  Dispense: 75 mL; Refill: 12 - budesonide-formoterol (SYMBICORT) 160-4.5 MCG/ACT inhaler; Inhale 2 puffs into the lungs 2 (two) times daily.  Dispense: 1 Inhaler; Refill: 3    Continue all other maintenance medications as listed above.  Follow up plan: Return in about 1 month (around 11/24/2017) for recheck.  Educational handout given for [place] PA-C [place] [address]  [place], [place] [address] [phone]   10/27/2017, 1:55 PM

## 2017-10-29 ENCOUNTER — Encounter: Payer: Self-pay | Admitting: Physician Assistant

## 2017-11-04 DIAGNOSIS — J449 Chronic obstructive pulmonary disease, unspecified: Secondary | ICD-10-CM | POA: Diagnosis not present

## 2017-11-10 DIAGNOSIS — Z1231 Encounter for screening mammogram for malignant neoplasm of breast: Secondary | ICD-10-CM | POA: Diagnosis not present

## 2017-11-10 LAB — HM MAMMOGRAPHY

## 2017-11-11 ENCOUNTER — Encounter: Payer: Self-pay | Admitting: *Deleted

## 2017-11-14 ENCOUNTER — Other Ambulatory Visit: Payer: Self-pay | Admitting: Physician Assistant

## 2017-11-14 DIAGNOSIS — N6320 Unspecified lump in the left breast, unspecified quadrant: Secondary | ICD-10-CM

## 2017-11-19 ENCOUNTER — Ambulatory Visit: Payer: Self-pay | Admitting: Gastroenterology

## 2017-11-26 ENCOUNTER — Encounter: Payer: Self-pay | Admitting: Physician Assistant

## 2017-11-26 ENCOUNTER — Ambulatory Visit (INDEPENDENT_AMBULATORY_CARE_PROVIDER_SITE_OTHER): Payer: PPO | Admitting: Physician Assistant

## 2017-11-26 VITALS — BP 150/77 | HR 68 | Temp 97.4°F | Ht 61.0 in | Wt 153.0 lb

## 2017-11-26 DIAGNOSIS — J449 Chronic obstructive pulmonary disease, unspecified: Secondary | ICD-10-CM

## 2017-11-28 NOTE — Progress Notes (Signed)
BP (!) 150/77   Pulse 68   Temp (!) 97.4 F (36.3 C) (Oral)   Ht '5\' 1"'  (1.549 m)   Wt 153 lb (69.4 kg)   BMI 28.91 kg/m    Subjective:    Patient ID: Susan Ward, female    DOB: [birth year], 52 y.o.   MRN: [id number]  HPI: Susan Ward is a 52 y.o. female presenting on 11/26/2017 for COPD (1 month follow up )  This patient comes in for one-month follow-up on her COPD.  She states she is doing much better from starting the Symbicort on a regular basis she can tell a great difference in her day-to-day breathing and she feels that she is able to use less albuterol on most days.  On the very hot and steamy days she still has to use it.  Past Medical History:  Diagnosis Date  . Depression   . GERD (gastroesophageal reflux disease)   . Hyperlipidemia   . Mood disorder ([place])    Relevant past medical, surgical, family and social history reviewed and updated as indicated. Interim medical history since our last visit reviewed. Allergies and medications reviewed and updated. DATA REVIEWED: CHART IN EPIC  Family History reviewed for pertinent findings.  Review of Systems  Constitutional: Negative.   HENT: Negative.   Eyes: Negative.   Respiratory: Negative.   Gastrointestinal: Negative.   Genitourinary: Negative.     Allergies as of 11/26/2017      Reactions   Aspirin Nausea And Vomiting   upset stomach   Oxycodone    Penicillin G Rash   Yeast Infection      Medication List        Accurate as of 11/26/17 11:59 PM. Always use your most recent med list.          albuterol 108 (90 Base) MCG/ACT inhaler Commonly known as:  PROVENTIL HFA;VENTOLIN HFA Inhale 2 puffs into the lungs every 6 (six) hours as needed for wheezing or shortness of breath.   albuterol 0.63 MG/3ML nebulizer solution Commonly known as:  ACCUNEB Take 3 mLs (0.63 mg total) by nebulization every 6 (six) hours as needed for wheezing.   atorvastatin 40 MG tablet Commonly known as:  LIPITOR Take 1  tablet (40 mg total) by mouth daily at 6 PM.   budesonide-formoterol 160-4.5 MCG/ACT inhaler Commonly known as:  SYMBICORT Inhale 2 puffs into the lungs 2 (two) times daily.   cetirizine 10 MG tablet Commonly known as:  ZYRTEC Take 1 tablet (10 mg total) by mouth daily.   DULoxetine 60 MG capsule Commonly known as:  CYMBALTA Take 60 mg by mouth 2 (two) times daily.   famotidine 40 MG tablet Commonly known as:  PEPCID Take 1 tablet (40 mg total) by mouth daily.   fluticasone 50 MCG/ACT nasal spray Commonly known as:  FLONASE Place 1 spray into both nostrils 2 (two) times daily.   HYDROcodone-acetaminophen 10-325 MG tablet Commonly known as:  NORCO Take 1 tablet by mouth every 6 (six) hours as needed.   HYDROcodone-acetaminophen 10-325 MG tablet Commonly known as:  NORCO Take 1 tablet by mouth every 6 (six) hours as needed.   HYDROcodone-acetaminophen 10-325 MG tablet Commonly known as:  NORCO Take 1 tablet by mouth every 6 (six) hours as needed.   linaclotide 145 MCG Caps capsule Commonly known as:  LINZESS Take 145 mcg by mouth daily before breakfast.   omeprazole 40 MG capsule Commonly known as:  PRILOSEC TAKE  ONE (1) CAPSULE EACH DAY   topiramate 100 MG tablet Commonly known as:  TOPAMAX TAKE ONE TABLET BY MOUTH TWICE DAILY   vitamin C 100 MG tablet Take 100 mg by mouth daily.   vitamin E 100 UNIT capsule Take by mouth daily.          Objective:    BP (!) 150/77   Pulse 68   Temp (!) 97.4 F (36.3 C) (Oral)   Ht '5\' 1"'  (1.549 m)   Wt 153 lb (69.4 kg)   BMI 28.91 kg/m   Allergies  Allergen Reactions  . Aspirin Nausea And Vomiting    upset stomach  . Oxycodone   . Penicillin G Rash    Yeast Infection    Wt Readings from Last 3 Encounters:  11/26/17 153 lb (69.4 kg)  10/27/17 152 lb 6.4 oz (69.1 kg)  09/30/17 150 lb (68 kg)    Physical Exam  Constitutional: She is oriented to person, place, and time. She appears well-developed and  well-nourished.  HENT:  Head: Normocephalic and atraumatic.  Eyes: Pupils are equal, round, and reactive to light. Conjunctivae and EOM are normal.  Cardiovascular: Normal rate, regular rhythm, normal heart sounds and intact distal pulses.  Pulmonary/Chest: Effort normal and breath sounds normal.  Abdominal: Soft. Bowel sounds are normal.  Neurological: She is alert and oriented to person, place, and time. She has normal reflexes.  Skin: Skin is warm and dry. No rash noted.  Psychiatric: She has a normal mood and affect. Her behavior is normal. Judgment and thought content normal.    Results for orders placed or performed in visit on 08/26/17  CBC with Differential/Platelet  Result Value Ref Range   WBC 9.0 3.4 - 10.8 x10E3/uL   RBC 4.56 3.77 - 5.28 x10E6/uL   Hemoglobin 14.0 11.1 - 15.9 g/dL   Hematocrit 41.9 34.0 - 46.6 %   MCV 92 79 - 97 fL   MCH 30.7 26.6 - 33.0 pg   MCHC 33.4 31.5 - 35.7 g/dL   RDW 13.0 12.3 - 15.4 %   Platelets 217 150 - 379 x10E3/uL   Neutrophils 56 Not Estab. %   Lymphs 34 Not Estab. %   Monocytes 6 Not Estab. %   Eos 4 Not Estab. %   Basos 0 Not Estab. %   Neutrophils Absolute 5.0 1.4 - 7.0 x10E3/uL   Lymphocytes Absolute 3.1 0.7 - 3.1 x10E3/uL   Monocytes Absolute 0.5 0.1 - 0.9 x10E3/uL   EOS (ABSOLUTE) 0.3 0.0 - 0.4 x10E3/uL   Basophils Absolute 0.0 0.0 - 0.2 x10E3/uL   Immature Granulocytes 0 Not Estab. %   Immature Grans (Abs) 0.0 0.0 - 0.1 x10E3/uL  CMP14+EGFR  Result Value Ref Range   Glucose 98 65 - 99 mg/dL   BUN 14 6 - 24 mg/dL   Creatinine, Ser 0.67 0.57 - 1.00 mg/dL   GFR calc non Af Amer 101 >59 mL/min/1.73   GFR calc Af Amer 116 >59 mL/min/1.73   BUN/Creatinine Ratio 21 9 - 23   Sodium 144 134 - 144 mmol/L   Potassium 4.2 3.5 - 5.2 mmol/L   Chloride 110 (H) 96 - 106 mmol/L   CO2 20 20 - 29 mmol/L   Calcium 9.4 8.7 - 10.2 mg/dL   Total Protein 6.6 6.0 - 8.5 g/dL   Albumin 4.2 3.5 - 5.5 g/dL   Globulin, Total 2.4 1.5 - 4.5 g/dL     Albumin/Globulin Ratio 1.8 1.2 - 2.2  Bilirubin Total <0.2 0.0 - 1.2 mg/dL   Alkaline Phosphatase 64 39 - 117 IU/L   AST 16 0 - 40 IU/L   ALT 14 0 - 32 IU/L  Lipid panel  Result Value Ref Range   Cholesterol, Total 170 100 - 199 mg/dL   Triglycerides 107 0 - 149 mg/dL   HDL 43 >39 mg/dL   VLDL Cholesterol Cal 21 5 - 40 mg/dL   LDL Calculated 106 (H) 0 - 99 mg/dL   Chol/HDL Ratio 4.0 0.0 - 4.4 ratio  TSH  Result Value Ref Range   TSH 1.320 0.450 - 4.500 uIU/mL      Assessment & Plan:   1. Chronic obstructive pulmonary disease, unspecified COPD type ([place]) continue symbicort and albuterol   Continue all other maintenance medications as listed above.  Follow up plan: No follow-ups on file.  Educational handout given for [place] PA-C [place] [address]  [place], [place] [address] [phone]   11/28/2017, 1:49 PM

## 2017-12-04 DIAGNOSIS — J449 Chronic obstructive pulmonary disease, unspecified: Secondary | ICD-10-CM | POA: Diagnosis not present

## 2017-12-23 ENCOUNTER — Ambulatory Visit (HOSPITAL_COMMUNITY)
Admission: RE | Admit: 2017-12-23 | Discharge: 2017-12-23 | Disposition: A | Discharge status: Home / Self Care | Payer: PPO | Source: Ambulatory Visit | Attending: Physician Assistant | Admitting: Physician Assistant

## 2017-12-23 DIAGNOSIS — N6320 Unspecified lump in the left breast, unspecified quadrant: Secondary | ICD-10-CM | POA: Insufficient documentation

## 2017-12-23 DIAGNOSIS — R928 Other abnormal and inconclusive findings on diagnostic imaging of breast: Secondary | ICD-10-CM | POA: Diagnosis not present

## 2017-12-23 DIAGNOSIS — N6012 Diffuse cystic mastopathy of left breast: Secondary | ICD-10-CM | POA: Diagnosis not present

## 2017-12-24 NOTE — Progress Notes (Signed)
In Care Everywhere  

## 2017-12-30 ENCOUNTER — Other Ambulatory Visit: Payer: Self-pay | Admitting: Physician Assistant

## 2017-12-30 DIAGNOSIS — M5136 Other intervertebral disc degeneration, lumbar region: Secondary | ICD-10-CM

## 2017-12-31 ENCOUNTER — Ambulatory Visit: Payer: PPO | Admitting: Physician Assistant

## 2018-01-01 ENCOUNTER — Telehealth: Payer: Self-pay | Admitting: Physician Assistant

## 2018-01-01 ENCOUNTER — Other Ambulatory Visit: Payer: Self-pay | Admitting: Physician Assistant

## 2018-01-01 DIAGNOSIS — M5136 Other intervertebral disc degeneration, lumbar region: Secondary | ICD-10-CM

## 2018-01-01 MED ORDER — HYDROCODONE-ACETAMINOPHEN 10-325 MG PO TABS: 1.0000 | ORAL_TABLET | Freq: Four times a day (QID) | ORAL | 0 refills | Status: DC | PRN

## 2018-01-01 NOTE — Telephone Encounter (Signed)
sent 

## 2018-01-01 NOTE — Telephone Encounter (Signed)
Please advise, patient has appointment for follow up on 01/07/18, looks like she will be about a week short

## 2018-01-02 NOTE — Telephone Encounter (Signed)
Patient aware.

## 2018-01-04 DIAGNOSIS — J449 Chronic obstructive pulmonary disease, unspecified: Secondary | ICD-10-CM | POA: Diagnosis not present

## 2018-01-07 ENCOUNTER — Ambulatory Visit (INDEPENDENT_AMBULATORY_CARE_PROVIDER_SITE_OTHER): Payer: PPO | Admitting: Physician Assistant

## 2018-01-07 ENCOUNTER — Encounter: Payer: Self-pay | Admitting: Physician Assistant

## 2018-01-07 DIAGNOSIS — M5136 Other intervertebral disc degeneration, lumbar region: Secondary | ICD-10-CM

## 2018-01-07 MED ORDER — HYDROCODONE-ACETAMINOPHEN 10-325 MG PO TABS: 1.0000 | ORAL_TABLET | ORAL | 0 refills | Status: DC | PRN

## 2018-01-07 MED ORDER — METHYLPREDNISOLONE ACETATE 80 MG/ML IJ SUSP
80.0000 mg | Freq: Once | INTRAMUSCULAR | Status: AC
Start: 2018-01-07 — End: 2018-01-07
  Administered 2018-01-07: 80 mg via INTRAMUSCULAR

## 2018-01-12 NOTE — Progress Notes (Signed)
BP 129/74   Pulse 76   Temp 98.4 F (36.9 C) (Oral)   Ht 5\' 1"  (1.549 m)   Wt 152 lb (68.9 kg)   BMI 28.72 kg/m    Subjective:    Patient ID: Susan Ward, female    DOB: 1965/12/14, 52 y.o.   MRN: [id number]  HPI: Susan Ward is a 52 y.o. female presenting on 01/07/2018 for Medical Management of Chronic Issues .Chronic pain is under control, patient has no new complaints. Medications are keeping things stable. Needs refills for the next three months.  [place] Controlled Substance website checked and normal. Drug screen normal this year. She has chronic degenerative disc disease throughout her spine.  She has not had to have any recent surgery.   Past Medical History:  Diagnosis Date  . Depression   . GERD (gastroesophageal reflux disease)   . Hyperlipidemia   . Mood disorder ([place])    Relevant past medical, surgical, family and social history reviewed and updated as indicated. Interim medical history since our last visit reviewed. Allergies and medications reviewed and updated. DATA REVIEWED: CHART IN EPIC  Family History reviewed for pertinent findings.  Review of Systems  Constitutional: Negative.   HENT: Negative.   Eyes: Negative.   Respiratory: Negative.   Gastrointestinal: Negative.   Genitourinary: Negative.     Allergies as of 01/07/2018      Reactions   Aspirin Nausea And Vomiting   upset stomach   Oxycodone    Penicillin G Rash   Yeast Infection      Medication List        Accurate as of 01/07/18 11:59 PM. Always use your most recent med list.          albuterol 108 (90 Base) MCG/ACT inhaler Commonly known as:  PROVENTIL HFA;VENTOLIN HFA Inhale 2 puffs into the lungs every 6 (six) hours as needed for wheezing or shortness of breath.   albuterol 0.63 MG/3ML nebulizer solution Commonly known as:  ACCUNEB Take 3 mLs (0.63 mg total) by nebulization every 6 (six) hours as needed for wheezing.   atorvastatin 40 MG tablet Commonly known as:   LIPITOR Take 1 tablet (40 mg total) by mouth daily at 6 PM.   budesonide-formoterol 160-4.5 MCG/ACT inhaler Commonly known as:  SYMBICORT Inhale 2 puffs into the lungs 2 (two) times daily.   cetirizine 10 MG tablet Commonly known as:  ZYRTEC Take 1 tablet (10 mg total) by mouth daily.   DULoxetine 60 MG capsule Commonly known as:  CYMBALTA Take 60 mg by mouth 2 (two) times daily.   famotidine 40 MG tablet Commonly known as:  PEPCID Take 1 tablet (40 mg total) by mouth daily.   fluticasone 50 MCG/ACT nasal spray Commonly known as:  FLONASE Place 1 spray into both nostrils 2 (two) times daily.   HYDROcodone-acetaminophen 10-325 MG tablet Commonly known as:  NORCO Take 1 tablet by mouth every 4 (four) hours as needed.   HYDROcodone-acetaminophen 10-325 MG tablet Commonly known as:  NORCO Take 1 tablet by mouth every 4 (four) hours as needed.   HYDROcodone-acetaminophen 10-325 MG tablet Commonly known as:  NORCO Take 1 tablet by mouth every 4 (four) hours as needed.   linaclotide 145 MCG Caps capsule Commonly known as:  LINZESS Take 145 mcg by mouth daily before breakfast.   omeprazole 40 MG capsule Commonly known as:  PRILOSEC TAKE ONE (1) CAPSULE EACH DAY   topiramate 100 MG tablet Commonly known as:  TOPAMAX TAKE ONE TABLET BY MOUTH TWICE DAILY   vitamin C 100 MG tablet Take 100 mg by mouth daily.   vitamin E 100 UNIT capsule Take by mouth daily.          Objective:    BP 129/74   Pulse 76   Temp 98.4 F (36.9 C) (Oral)   Ht 5\' 1"  (1.549 m)   Wt 152 lb (68.9 kg)   BMI 28.72 kg/m   Allergies  Allergen Reactions  . Aspirin Nausea And Vomiting    upset stomach  . Oxycodone   . Penicillin G Rash    Yeast Infection    Wt Readings from Last 3 Encounters:  01/07/18 152 lb (68.9 kg)  11/26/17 153 lb (69.4 kg)  10/27/17 152 lb 6.4 oz (69.1 kg)    Physical Exam  Constitutional: She is oriented to person, place, and time. She appears  well-developed and well-nourished.  HENT:  Head: Normocephalic and atraumatic.  Eyes: Pupils are equal, round, and reactive to light. Conjunctivae and EOM are normal.  Cardiovascular: Normal rate, regular rhythm, normal heart sounds and intact distal pulses.  Pulmonary/Chest: Effort normal and breath sounds normal.  Abdominal: Soft. Bowel sounds are normal.  Neurological: She is alert and oriented to person, place, and time. She has normal reflexes.  Skin: Skin is warm and dry. No rash noted.  Psychiatric: She has a normal mood and affect. Her behavior is normal. Judgment and thought content normal.        Assessment & Plan:   1. DDD (degenerative disc disease), lumbar - methylPREDNISolone acetate (DEPO-MEDROL) injection 80 mg - HYDROcodone-acetaminophen (NORCO) 10-325 MG tablet; Take 1 tablet by mouth every 4 (four) hours as needed.  Dispense: 150 tablet; Refill: 0 - HYDROcodone-acetaminophen (NORCO) 10-325 MG tablet; Take 1 tablet by mouth every 4 (four) hours as needed.  Dispense: 180 tablet; Refill: 0 - HYDROcodone-acetaminophen (NORCO) 10-325 MG tablet; Take 1 tablet by mouth every 4 (four) hours as needed.  Dispense: 150 tablet; Refill: 0   Continue all other maintenance medications as listed above.  Follow up plan: Return in about 3 months (around 04/09/2018) for med recheck.  Educational handout given for  Terald Sleeper PA-C [place] [address]  [place], [place] [address] [phone]   01/12/2018, 10:17 PM

## 2018-01-23 ENCOUNTER — Other Ambulatory Visit: Payer: Self-pay | Admitting: Physician Assistant

## 2018-01-23 DIAGNOSIS — K219 Gastro-esophageal reflux disease without esophagitis: Secondary | ICD-10-CM

## 2018-02-04 DIAGNOSIS — J449 Chronic obstructive pulmonary disease, unspecified: Secondary | ICD-10-CM | POA: Diagnosis not present

## 2018-03-06 DIAGNOSIS — J449 Chronic obstructive pulmonary disease, unspecified: Secondary | ICD-10-CM | POA: Diagnosis not present

## 2018-03-26 ENCOUNTER — Ambulatory Visit (INDEPENDENT_AMBULATORY_CARE_PROVIDER_SITE_OTHER): Payer: PPO

## 2018-03-26 DIAGNOSIS — Z23 Encounter for immunization: Secondary | ICD-10-CM

## 2018-04-06 DIAGNOSIS — J449 Chronic obstructive pulmonary disease, unspecified: Secondary | ICD-10-CM | POA: Diagnosis not present

## 2018-04-10 ENCOUNTER — Encounter: Payer: Self-pay | Admitting: Physician Assistant

## 2018-04-10 ENCOUNTER — Ambulatory Visit (INDEPENDENT_AMBULATORY_CARE_PROVIDER_SITE_OTHER): Payer: PPO | Admitting: Physician Assistant

## 2018-04-10 VITALS — BP 130/68 | HR 73 | Temp 97.7°F | Ht 61.0 in | Wt 152.8 lb

## 2018-04-10 DIAGNOSIS — M5441 Lumbago with sciatica, right side: Secondary | ICD-10-CM | POA: Diagnosis not present

## 2018-04-10 DIAGNOSIS — E7800 Pure hypercholesterolemia, unspecified: Secondary | ICD-10-CM

## 2018-04-10 DIAGNOSIS — M5442 Lumbago with sciatica, left side: Secondary | ICD-10-CM | POA: Diagnosis not present

## 2018-04-10 DIAGNOSIS — G8929 Other chronic pain: Secondary | ICD-10-CM

## 2018-04-10 DIAGNOSIS — K219 Gastro-esophageal reflux disease without esophagitis: Secondary | ICD-10-CM | POA: Diagnosis not present

## 2018-04-10 DIAGNOSIS — M5136 Other intervertebral disc degeneration, lumbar region: Secondary | ICD-10-CM

## 2018-04-10 DIAGNOSIS — M51369 Other intervertebral disc degeneration, lumbar region without mention of lumbar back pain or lower extremity pain: Secondary | ICD-10-CM

## 2018-04-10 MED ORDER — HYDROCODONE-ACETAMINOPHEN 10-325 MG PO TABS: 1.0000 | ORAL_TABLET | ORAL | 0 refills | Status: DC | PRN

## 2018-04-10 MED ORDER — DULOXETINE HCL 60 MG PO CPEP: 60.0000 mg | ORAL_CAPSULE | Freq: Two times a day (BID) | ORAL | 3 refills | Status: DC

## 2018-04-10 MED ORDER — OMEPRAZOLE 40 MG PO CPDR: DELAYED_RELEASE_CAPSULE | ORAL | 3 refills | Status: DC

## 2018-04-10 MED ORDER — ATORVASTATIN CALCIUM 40 MG PO TABS: 40.0000 mg | ORAL_TABLET | Freq: Every day | ORAL | 3 refills | Status: DC

## 2018-04-10 NOTE — Patient Instructions (Signed)
In a few days you may receive a survey in the mail or online from [employer] regarding your visit with us today. Please take a moment to fill this out. Your feedback is very important to our whole office. It can help us better understand your needs as well as improve your experience and satisfaction. Thank you for taking your time to complete it. We care about you.  Reather Steller, PA-C  

## 2018-04-11 DIAGNOSIS — M5442 Lumbago with sciatica, left side: Principal | ICD-10-CM

## 2018-04-11 DIAGNOSIS — G8929 Other chronic pain: Secondary | ICD-10-CM | POA: Insufficient documentation

## 2018-04-11 DIAGNOSIS — M5441 Lumbago with sciatica, right side: Principal | ICD-10-CM

## 2018-04-11 NOTE — Progress Notes (Signed)
BP 130/68   Pulse 73   Temp 97.7 F (36.5 C) (Oral)   Ht 5\' 1"  (1.549 m)   Wt 152 lb 12.8 oz (69.3 kg)   BMI 28.87 kg/m    Subjective:    Patient ID: Susan Ward, female    DOB: [birth year], 52 y.o.   MRN: [id number]  HPI: Susan Ward is a 52 y.o. female presenting on 04/10/2018 for Pain (3 month follow up )  This patient comes in for periodic recheck on medications and conditions including DDD, GERD, hyperlipid, chronic back pain.  She is doing well overall and does need refills. NEW narcotic contract is reviewed and signed at today's visit.   All medications are reviewed today. There are no reports of any problems with the medications. All of the medical conditions are reviewed and updated.  Lab work is reviewed and will be ordered as medically necessary. There are no new problems reported with today's visit.    Past Medical History:  Diagnosis Date  . Depression   . GERD (gastroesophageal reflux disease)   . Hyperlipidemia   . Mood disorder ([place])    Relevant past medical, surgical, family and social history reviewed and updated as indicated. Interim medical history since our last visit reviewed. Allergies and medications reviewed and updated. DATA REVIEWED: CHART IN EPIC  Family History reviewed for pertinent findings.  Review of Systems  Constitutional: Negative.   HENT: Negative.   Eyes: Negative.   Respiratory: Negative.   Gastrointestinal: Negative.   Genitourinary: Negative.   Musculoskeletal: Positive for arthralgias, back pain, myalgias, neck pain and neck stiffness.    Allergies as of 04/10/2018      Reactions   Aspirin Nausea And Vomiting   upset stomach   Oxycodone    Penicillin G Rash   Yeast Infection      Medication List        Accurate as of 04/10/18 11:59 PM. Always use your most recent med list.          albuterol 108 (90 Base) MCG/ACT inhaler Commonly known as:  PROVENTIL HFA;VENTOLIN HFA Inhale 2 puffs into the lungs every 6  (six) hours as needed for wheezing or shortness of breath.   albuterol 0.63 MG/3ML nebulizer solution Commonly known as:  ACCUNEB Take 3 mLs (0.63 mg total) by nebulization every 6 (six) hours as needed for wheezing.   atorvastatin 40 MG tablet Commonly known as:  LIPITOR Take 1 tablet (40 mg total) by mouth daily at 6 PM.   budesonide-formoterol 160-4.5 MCG/ACT inhaler Commonly known as:  SYMBICORT Inhale 2 puffs into the lungs 2 (two) times daily.   cetirizine 10 MG tablet Commonly known as:  ZYRTEC Take 1 tablet (10 mg total) by mouth daily.   DULoxetine 60 MG capsule Commonly known as:  CYMBALTA Take 1 capsule (60 mg total) by mouth 2 (two) times daily.   famotidine 40 MG tablet Commonly known as:  PEPCID Take 1 tablet (40 mg total) by mouth daily.   fluticasone 50 MCG/ACT nasal spray Commonly known as:  FLONASE Place 1 spray into both nostrils 2 (two) times daily.   HYDROcodone-acetaminophen 10-325 MG tablet Commonly known as:  NORCO Take 1 tablet by mouth every 4 (four) hours as needed.   HYDROcodone-acetaminophen 10-325 MG tablet Commonly known as:  NORCO Take 1 tablet by mouth every 4 (four) hours as needed.   HYDROcodone-acetaminophen 10-325 MG tablet Commonly known as:  NORCO Take 1 tablet by mouth every  4 (four) hours as needed.   linaclotide 145 MCG Caps capsule Commonly known as:  LINZESS Take 145 mcg by mouth daily before breakfast.   omeprazole 40 MG capsule Commonly known as:  PRILOSEC TAKE ONE (1) CAPSULE EACH DAY   topiramate 100 MG tablet Commonly known as:  TOPAMAX TAKE ONE TABLET BY MOUTH TWICE DAILY   vitamin C 100 MG tablet Take 100 mg by mouth daily.   vitamin E 100 UNIT capsule Take by mouth daily.          Objective:    BP 130/68   Pulse 73   Temp 97.7 F (36.5 C) (Oral)   Ht 5\' 1"  (1.549 m)   Wt 152 lb 12.8 oz (69.3 kg)   BMI 28.87 kg/m   Allergies  Allergen Reactions  . Aspirin Nausea And Vomiting    upset  stomach  . Oxycodone   . Penicillin G Rash    Yeast Infection    Wt Readings from Last 3 Encounters:  04/10/18 152 lb 12.8 oz (69.3 kg)  01/07/18 152 lb (68.9 kg)  11/26/17 153 lb (69.4 kg)    Physical Exam  Constitutional: She is oriented to person, place, and time. She appears well-developed and well-nourished.  HENT:  Head: Normocephalic and atraumatic.  Eyes: Pupils are equal, round, and reactive to light. Conjunctivae and EOM are normal.  Cardiovascular: Normal rate, regular rhythm, normal heart sounds and intact distal pulses.  Pulmonary/Chest: Effort normal and breath sounds normal.  Abdominal: Soft. Bowel sounds are normal.  Neurological: She is alert and oriented to person, place, and time. She has normal reflexes.  Skin: Skin is warm and dry. No rash noted.  Psychiatric: She has a normal mood and affect. Her behavior is normal. Judgment and thought content normal.    Results for orders placed or performed in visit on 12/24/17  HM MAMMOGRAPHY  Result Value Ref Range   HM Mammogram 0-4 Bi-Rad 0-4 Bi-Rad, Self Reported Normal      Assessment & Plan:   1. DDD (degenerative disc disease), lumbar - HYDROcodone-acetaminophen (NORCO) 10-325 MG tablet; Take 1 tablet by mouth every 4 (four) hours as needed.  Dispense: 150 tablet; Refill: 0 - HYDROcodone-acetaminophen (NORCO) 10-325 MG tablet; Take 1 tablet by mouth every 4 (four) hours as needed.  Dispense: 180 tablet; Refill: 0 - HYDROcodone-acetaminophen (NORCO) 10-325 MG tablet; Take 1 tablet by mouth every 4 (four) hours as needed.  Dispense: 150 tablet; Refill: 0  2. Gastroesophageal reflux disease without esophagitis - omeprazole (PRILOSEC) 40 MG capsule; TAKE ONE (1) CAPSULE EACH DAY  Dispense: 90 capsule; Refill: 3  3. Pure hypercholesterolemia - atorvastatin (LIPITOR) 40 MG tablet; Take 1 tablet (40 mg total) by mouth daily at 6 PM.  Dispense: 90 tablet; Refill: 3  4. Chronic bilateral low back pain with bilateral  sciatica - ToxASSURE Select 13 (MW), Urine   Continue all other maintenance medications as listed above.  Follow up plan: No follow-ups on file.  Educational handout given for [place] PA-C [place] [address]  [place], [place] [address] [phone]   04/11/2018, 5:47 PM

## 2018-04-14 LAB — TOXASSURE SELECT 13 (MW), URINE

## 2018-05-06 DIAGNOSIS — J449 Chronic obstructive pulmonary disease, unspecified: Secondary | ICD-10-CM | POA: Diagnosis not present

## 2018-05-22 ENCOUNTER — Ambulatory Visit (INDEPENDENT_AMBULATORY_CARE_PROVIDER_SITE_OTHER): Payer: PPO | Admitting: *Deleted

## 2018-05-22 VITALS — BP 134/71 | HR 64 | Temp 97.6°F | Ht 61.0 in | Wt 152.0 lb

## 2018-05-22 DIAGNOSIS — Z0000 Encounter for general adult medical examination without abnormal findings: Secondary | ICD-10-CM | POA: Diagnosis not present

## 2018-05-22 NOTE — Patient Instructions (Signed)
  Susan Ward , Thank you for taking time to come for your Medicare Wellness Visit. I appreciate your ongoing commitment to your health goals. Please review the following plan we discussed and let me know if I can assist you in the future.   These are the goals we discussed: Goals    . Exercise 3x per week (30 min per time)    . Have 3 meals a day       This is a list of the screening recommended for you and due dates:  Health Maintenance  Topic Date Due  . Colon Cancer Screening  05/23/2019*  .  Hepatitis C: One time screening is recommended by Center for Disease Control  (CDC) for  adults born from [birth year] through 1967.   05/23/2019*  . HIV Screening  05/23/2019*  . Mammogram  11/11/2019  . Pap Smear  08/18/2020  . Tetanus Vaccine  01/22/2026  . Flu Shot  Completed  *Topic was postponed. The date shown is not the original due date.    Keep follow up with Particia Nearing Try and eat 3 meals a day
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
## 2018-06-06 DIAGNOSIS — J449 Chronic obstructive pulmonary disease, unspecified: Secondary | ICD-10-CM | POA: Diagnosis not present

## 2018-07-07 DIAGNOSIS — J449 Chronic obstructive pulmonary disease, unspecified: Secondary | ICD-10-CM | POA: Diagnosis not present

## 2018-07-13 ENCOUNTER — Ambulatory Visit (INDEPENDENT_AMBULATORY_CARE_PROVIDER_SITE_OTHER): Payer: PPO | Admitting: Physician Assistant

## 2018-07-13 ENCOUNTER — Other Ambulatory Visit: Payer: Self-pay | Admitting: Physician Assistant

## 2018-07-13 ENCOUNTER — Encounter: Payer: Self-pay | Admitting: Physician Assistant

## 2018-07-13 VITALS — BP 152/74 | HR 68 | Temp 98.2°F | Ht 61.0 in | Wt 154.4 lb

## 2018-07-13 DIAGNOSIS — M5441 Lumbago with sciatica, right side: Secondary | ICD-10-CM | POA: Diagnosis not present

## 2018-07-13 DIAGNOSIS — G43819 Other migraine, intractable, without status migrainosus: Secondary | ICD-10-CM

## 2018-07-13 DIAGNOSIS — M5442 Lumbago with sciatica, left side: Secondary | ICD-10-CM | POA: Diagnosis not present

## 2018-07-13 DIAGNOSIS — M5136 Other intervertebral disc degeneration, lumbar region: Secondary | ICD-10-CM

## 2018-07-13 DIAGNOSIS — G8929 Other chronic pain: Secondary | ICD-10-CM | POA: Diagnosis not present

## 2018-07-13 DIAGNOSIS — R631 Polydipsia: Secondary | ICD-10-CM | POA: Diagnosis not present

## 2018-07-13 LAB — BMP8+EGFR
BUN/Creatinine Ratio: 13 (ref 9–23)
BUN: 9 mg/dL (ref 6–24)
CO2: 17 mmol/L — ABNORMAL LOW (ref 20–29)
Calcium: 9.9 mg/dL (ref 8.7–10.2)
Chloride: 109 mmol/L — ABNORMAL HIGH (ref 96–106)
Creatinine, Ser: 0.72 mg/dL (ref 0.57–1.00)
GFR calc Af Amer: 110 mL/min/{1.73_m2} (ref >59)
GFR calc non Af Amer: 95 mL/min/{1.73_m2} (ref >59)
Glucose: 99 mg/dL (ref 65–99)
Potassium: 4.6 mmol/L (ref 3.5–5.2)
Sodium: 144 mmol/L (ref 134–144)

## 2018-07-13 LAB — BAYER DCA HB A1C WAIVED: HB A1C (BAYER DCA - WAIVED): 5.3 % (ref <7.0)

## 2018-07-13 MED ORDER — HYDROCODONE-ACETAMINOPHEN 10-325 MG PO TABS: 1.0000 | ORAL_TABLET | ORAL | 0 refills | Status: DC | PRN

## 2018-07-13 MED ORDER — GABAPENTIN 100 MG PO CAPS: 100.0000 mg | ORAL_CAPSULE | Freq: Every day | ORAL | 3 refills | Status: DC

## 2018-07-13 MED ORDER — TOPIRAMATE 200 MG PO TABS: 200.0000 mg | ORAL_TABLET | Freq: Two times a day (BID) | ORAL | 5 refills | Status: DC
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
## 2018-07-16 LAB — TOXASSURE SELECT 13 (MW), URINE

## 2018-07-24 ENCOUNTER — Other Ambulatory Visit: Payer: Self-pay | Admitting: Physician Assistant

## 2018-07-24 DIAGNOSIS — L509 Urticaria, unspecified: Secondary | ICD-10-CM

## 2018-08-05 DIAGNOSIS — J449 Chronic obstructive pulmonary disease, unspecified: Secondary | ICD-10-CM | POA: Diagnosis not present

## 2018-09-05 DIAGNOSIS — J449 Chronic obstructive pulmonary disease, unspecified: Secondary | ICD-10-CM | POA: Diagnosis not present

## 2018-10-01 ENCOUNTER — Other Ambulatory Visit: Payer: Self-pay | Admitting: Physician Assistant

## 2018-10-01 DIAGNOSIS — L509 Urticaria, unspecified: Secondary | ICD-10-CM

## 2018-10-05 DIAGNOSIS — J449 Chronic obstructive pulmonary disease, unspecified: Secondary | ICD-10-CM | POA: Diagnosis not present

## 2018-10-14 ENCOUNTER — Ambulatory Visit (INDEPENDENT_AMBULATORY_CARE_PROVIDER_SITE_OTHER): Payer: PPO | Admitting: Physician Assistant

## 2018-10-14 ENCOUNTER — Other Ambulatory Visit: Payer: Self-pay

## 2018-10-14 ENCOUNTER — Encounter: Payer: Self-pay | Admitting: Physician Assistant

## 2018-10-14 VITALS — BP 147/63 | HR 62 | Temp 98.3°F | Ht 61.0 in | Wt 156.2 lb

## 2018-10-14 DIAGNOSIS — M5134 Other intervertebral disc degeneration, thoracic region: Secondary | ICD-10-CM | POA: Diagnosis not present

## 2018-10-14 DIAGNOSIS — M5442 Lumbago with sciatica, left side: Secondary | ICD-10-CM | POA: Diagnosis not present

## 2018-10-14 DIAGNOSIS — Z0000 Encounter for general adult medical examination without abnormal findings: Secondary | ICD-10-CM | POA: Diagnosis not present

## 2018-10-14 DIAGNOSIS — G43819 Other migraine, intractable, without status migrainosus: Secondary | ICD-10-CM | POA: Diagnosis not present

## 2018-10-14 DIAGNOSIS — G8929 Other chronic pain: Secondary | ICD-10-CM | POA: Diagnosis not present

## 2018-10-14 DIAGNOSIS — M5441 Lumbago with sciatica, right side: Secondary | ICD-10-CM | POA: Diagnosis not present

## 2018-10-14 DIAGNOSIS — M48062 Spinal stenosis, lumbar region with neurogenic claudication: Secondary | ICD-10-CM | POA: Diagnosis not present

## 2018-10-14 DIAGNOSIS — M5136 Other intervertebral disc degeneration, lumbar region: Secondary | ICD-10-CM | POA: Diagnosis not present

## 2018-10-14 LAB — LIPID PANEL

## 2018-10-14 MED ORDER — HYDROCODONE-ACETAMINOPHEN 10-325 MG PO TABS: 1.0000 | ORAL_TABLET | ORAL | 0 refills | Status: DC | PRN

## 2018-10-14 MED ORDER — GABAPENTIN 100 MG PO CAPS: 300.0000 mg | ORAL_CAPSULE | Freq: Three times a day (TID) | ORAL | 3 refills | Status: DC

## 2018-10-14 MED ORDER — TOPIRAMATE 200 MG PO TABS: 200.0000 mg | ORAL_TABLET | Freq: Two times a day (BID) | ORAL | 5 refills | Status: DC
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
## 2018-10-15 LAB — CBC WITH DIFFERENTIAL/PLATELET
Basophils Absolute: 0 10*3/uL (ref 0.0–0.2)
Basos: 0 %
EOS (ABSOLUTE): 0.4 10*3/uL (ref 0.0–0.4)
Eos: 4 %
Hematocrit: 42.5 % (ref 34.0–46.6)
Hemoglobin: 14.4 g/dL (ref 11.1–15.9)
Immature Grans (Abs): 0 10*3/uL (ref 0.0–0.1)
Immature Granulocytes: 0 %
Lymphocytes Absolute: 3.2 10*3/uL — ABNORMAL HIGH (ref 0.7–3.1)
Lymphs: 34 %
MCH: 31.6 pg (ref 26.6–33.0)
MCHC: 33.9 g/dL (ref 31.5–35.7)
MCV: 93 fL (ref 79–97)
Monocytes Absolute: 1.1 10*3/uL — ABNORMAL HIGH (ref 0.1–0.9)
Monocytes: 12 %
Neutrophils Absolute: 4.6 10*3/uL (ref 1.4–7.0)
Neutrophils: 50 %
Platelets: 210 10*3/uL (ref 150–450)
RBC: 4.56 x10E6/uL (ref 3.77–5.28)
RDW: 12.1 % (ref 11.7–15.4)
WBC: 9.5 10*3/uL (ref 3.4–10.8)

## 2018-10-15 LAB — CMP14+EGFR
ALT: 12 IU/L (ref 0–32)
AST: 17 IU/L (ref 0–40)
Albumin/Globulin Ratio: 1.9 (ref 1.2–2.2)
Albumin: 4.3 g/dL (ref 3.8–4.9)
Alkaline Phosphatase: 68 IU/L (ref 39–117)
BUN/Creatinine Ratio: 17 (ref 9–23)
BUN: 14 mg/dL (ref 6–24)
Bilirubin Total: <0.2 mg/dL (ref 0.0–1.2)
CO2: 20 mmol/L (ref 20–29)
Calcium: 9.9 mg/dL (ref 8.7–10.2)
Chloride: 109 mmol/L — ABNORMAL HIGH (ref 96–106)
Creatinine, Ser: 0.82 mg/dL (ref 0.57–1.00)
GFR calc Af Amer: 94 mL/min/{1.73_m2} (ref >59)
GFR calc non Af Amer: 81 mL/min/{1.73_m2} (ref >59)
Globulin, Total: 2.3 g/dL (ref 1.5–4.5)
Glucose: 105 mg/dL — ABNORMAL HIGH (ref 65–99)
Potassium: 4.4 mmol/L (ref 3.5–5.2)
Sodium: 146 mmol/L — ABNORMAL HIGH (ref 134–144)
Total Protein: 6.6 g/dL (ref 6.0–8.5)

## 2018-10-15 LAB — LIPID PANEL
Chol/HDL Ratio: 5.8 ratio — ABNORMAL HIGH (ref 0.0–4.4)
Cholesterol, Total: 196 mg/dL (ref 100–199)
HDL: 34 mg/dL — ABNORMAL LOW (ref >39)
LDL Calculated: 102 mg/dL — ABNORMAL HIGH (ref 0–99)
Triglycerides: 300 mg/dL — ABNORMAL HIGH (ref 0–149)
VLDL Cholesterol Cal: 60 mg/dL — ABNORMAL HIGH (ref 5–40)

## 2018-10-15 LAB — TSH: TSH: 2.53 u[IU]/mL (ref 0.450–4.500)

## 2018-10-16 ENCOUNTER — Telehealth: Payer: Self-pay | Admitting: Physician Assistant

## 2018-10-16 NOTE — Telephone Encounter (Signed)
Patient aware of results.

## 2018-11-05 DIAGNOSIS — J449 Chronic obstructive pulmonary disease, unspecified: Secondary | ICD-10-CM | POA: Diagnosis not present

## 2018-11-19 ENCOUNTER — Other Ambulatory Visit: Payer: Self-pay | Admitting: Physician Assistant

## 2018-11-19 ENCOUNTER — Telehealth: Payer: Self-pay | Admitting: Physician Assistant

## 2018-11-19 NOTE — Telephone Encounter (Signed)
I do not see any medication that is 75 mg

## 2018-11-24 ENCOUNTER — Ambulatory Visit (INDEPENDENT_AMBULATORY_CARE_PROVIDER_SITE_OTHER): Payer: PPO | Admitting: Physician Assistant

## 2018-11-24 ENCOUNTER — Encounter: Payer: Self-pay | Admitting: Physician Assistant

## 2018-11-24 ENCOUNTER — Other Ambulatory Visit: Payer: Self-pay

## 2018-11-24 DIAGNOSIS — G43819 Other migraine, intractable, without status migrainosus: Secondary | ICD-10-CM | POA: Diagnosis not present

## 2018-11-24 MED ORDER — NURTEC 75 MG PO TBDP: 75.0000 mg | ORAL_TABLET | Freq: Every day | ORAL | 5 refills | Status: DC

## 2018-11-24 NOTE — Telephone Encounter (Signed)
Sent today- patient states need PA
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
## 2018-11-24 NOTE — Telephone Encounter (Signed)
Patient has appointment with Glenard Haring today - this encounter will be closed.

## 2018-12-01 NOTE — Telephone Encounter (Signed)
Prior Auth request for Nurtec 75MG  dispersible tablets- In Process  To check for an update later, open this request again from your dashboard. If you have any questions please contact [facility] at [phone].

## 2018-12-03 NOTE — Telephone Encounter (Signed)
NURTEC 75MG    PA Case: [id number], Status: Approved, Coverage Starts on: 12/02/2018 12:00:00 AM, Coverage Ends on: 05/20/2019

## 2018-12-22 ENCOUNTER — Ambulatory Visit: Payer: PPO | Admitting: Physician Assistant

## 2018-12-30 ENCOUNTER — Other Ambulatory Visit: Payer: Self-pay

## 2018-12-31 ENCOUNTER — Ambulatory Visit (INDEPENDENT_AMBULATORY_CARE_PROVIDER_SITE_OTHER): Payer: PPO | Admitting: Physician Assistant

## 2018-12-31 ENCOUNTER — Encounter: Payer: Self-pay | Admitting: Physician Assistant

## 2018-12-31 DIAGNOSIS — M5134 Other intervertebral disc degeneration, thoracic region: Secondary | ICD-10-CM

## 2018-12-31 DIAGNOSIS — M48062 Spinal stenosis, lumbar region with neurogenic claudication: Secondary | ICD-10-CM

## 2018-12-31 DIAGNOSIS — M5441 Lumbago with sciatica, right side: Secondary | ICD-10-CM

## 2018-12-31 DIAGNOSIS — M5442 Lumbago with sciatica, left side: Secondary | ICD-10-CM

## 2018-12-31 DIAGNOSIS — G8929 Other chronic pain: Secondary | ICD-10-CM | POA: Diagnosis not present

## 2018-12-31 DIAGNOSIS — M5136 Other intervertebral disc degeneration, lumbar region: Secondary | ICD-10-CM

## 2018-12-31 MED ORDER — GABAPENTIN 300 MG PO CAPS: 300.0000 mg | ORAL_CAPSULE | Freq: Two times a day (BID) | ORAL | 5 refills | Status: DC

## 2018-12-31 MED ORDER — HYDROCODONE-ACETAMINOPHEN 10-325 MG PO TABS: 1.0000 | ORAL_TABLET | ORAL | 0 refills | Status: DC | PRN
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
## 2019-01-18 ENCOUNTER — Ambulatory Visit: Payer: PPO | Admitting: Physician Assistant

## 2019-02-08 ENCOUNTER — Other Ambulatory Visit: Payer: Self-pay

## 2019-02-08 ENCOUNTER — Ambulatory Visit (INDEPENDENT_AMBULATORY_CARE_PROVIDER_SITE_OTHER): Payer: PPO | Admitting: *Deleted

## 2019-02-08 DIAGNOSIS — Z23 Encounter for immunization: Secondary | ICD-10-CM

## 2019-02-08 NOTE — Progress Notes (Signed)
Flu shot given and patient tolerated well.  

## 2019-03-31 ENCOUNTER — Other Ambulatory Visit: Payer: Self-pay | Admitting: Physician Assistant

## 2019-03-31 DIAGNOSIS — K219 Gastro-esophageal reflux disease without esophagitis: Secondary | ICD-10-CM

## 2019-04-01 ENCOUNTER — Other Ambulatory Visit: Payer: Self-pay

## 2019-04-02 ENCOUNTER — Encounter: Payer: Self-pay | Admitting: Physician Assistant

## 2019-04-02 ENCOUNTER — Ambulatory Visit (INDEPENDENT_AMBULATORY_CARE_PROVIDER_SITE_OTHER): Payer: PPO | Admitting: Physician Assistant

## 2019-04-02 ENCOUNTER — Ambulatory Visit (INDEPENDENT_AMBULATORY_CARE_PROVIDER_SITE_OTHER): Payer: PPO

## 2019-04-02 VITALS — BP 131/65 | HR 71 | Temp 97.5°F | Ht 61.0 in | Wt 163.2 lb

## 2019-04-02 DIAGNOSIS — M48062 Spinal stenosis, lumbar region with neurogenic claudication: Secondary | ICD-10-CM

## 2019-04-02 DIAGNOSIS — K5900 Constipation, unspecified: Secondary | ICD-10-CM | POA: Diagnosis not present

## 2019-04-02 DIAGNOSIS — G8929 Other chronic pain: Secondary | ICD-10-CM | POA: Diagnosis not present

## 2019-04-02 DIAGNOSIS — M5442 Lumbago with sciatica, left side: Secondary | ICD-10-CM | POA: Diagnosis not present

## 2019-04-02 DIAGNOSIS — F339 Major depressive disorder, recurrent, unspecified: Secondary | ICD-10-CM | POA: Diagnosis not present

## 2019-04-02 DIAGNOSIS — M5136 Other intervertebral disc degeneration, lumbar region: Secondary | ICD-10-CM

## 2019-04-02 DIAGNOSIS — M5134 Other intervertebral disc degeneration, thoracic region: Secondary | ICD-10-CM | POA: Diagnosis not present

## 2019-04-02 DIAGNOSIS — M48061 Spinal stenosis, lumbar region without neurogenic claudication: Secondary | ICD-10-CM | POA: Diagnosis not present

## 2019-04-02 DIAGNOSIS — M5441 Lumbago with sciatica, right side: Secondary | ICD-10-CM | POA: Diagnosis not present

## 2019-04-02 DIAGNOSIS — E7800 Pure hypercholesterolemia, unspecified: Secondary | ICD-10-CM | POA: Diagnosis not present

## 2019-04-02 MED ORDER — LINACLOTIDE 72 MCG PO CAPS: 145.0000 ug | ORAL_CAPSULE | Freq: Every day | ORAL | 5 refills | Status: DC

## 2019-04-02 MED ORDER — LINACLOTIDE 145 MCG PO CAPS: 145.0000 ug | ORAL_CAPSULE | Freq: Every day | ORAL | 5 refills | Status: AC

## 2019-04-02 MED ORDER — HYDROCODONE-ACETAMINOPHEN 10-325 MG PO TABS: 1.0000 | ORAL_TABLET | ORAL | 0 refills | Status: DC | PRN

## 2019-04-02 MED ORDER — DULOXETINE HCL 60 MG PO CPEP: 60.0000 mg | ORAL_CAPSULE | Freq: Two times a day (BID) | ORAL | 3 refills | Status: DC

## 2019-04-02 MED ORDER — ATORVASTATIN CALCIUM 40 MG PO TABS: 40.0000 mg | ORAL_TABLET | Freq: Every day | ORAL | 3 refills | Status: DC

## 2019-04-02 MED ORDER — GABAPENTIN 300 MG PO CAPS: 900.0000 mg | ORAL_CAPSULE | Freq: Three times a day (TID) | ORAL | 5 refills | Status: DC

## 2019-04-02 NOTE — Patient Instructions (Signed)
Your child needs to take Miralax, a powder that you mix in a clear liquid.   1. Stir the Miralax powder into water, juice, or Gatorade. Your child's Miralax dose is:   8 capfuls of Miralax powder in 32 to 64 ounces of liquid  2. Give your child 4 to 8 ounces to drink every 30 minutes. It will take 4 to 6 hours for your child to finish the medicine. 3. After the medicine is gone, have your child drink more water or juice. This will help with the cleanout.  After the clean out, your child will take a daily (maintenance) medicine for at least 6 months. 1 capful of powder in 8 ounces of liquid every day  Your child may have stomach pain or cramping during the clean out. This might mean your child has to go to the bathroom. Have your child sit on the toilet. Explain that the pain will go away when the stool is gone. You may want to read to your child while you wait. A warm bath may also help.  Your child should have almost clear liquid stools by the end of the next day.
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
## 2019-04-03 LAB — CMP14+EGFR
ALT: 11 IU/L (ref 0–32)
AST: 15 IU/L (ref 0–40)
Albumin/Globulin Ratio: 2 (ref 1.2–2.2)
Albumin: 4.5 g/dL (ref 3.8–4.9)
Alkaline Phosphatase: 88 IU/L (ref 39–117)
BUN/Creatinine Ratio: 14 (ref 9–23)
BUN: 13 mg/dL (ref 6–24)
Bilirubin Total: <0.2 mg/dL (ref 0.0–1.2)
CO2: 21 mmol/L (ref 20–29)
Calcium: 9.7 mg/dL (ref 8.7–10.2)
Chloride: 107 mmol/L — ABNORMAL HIGH (ref 96–106)
Creatinine, Ser: 0.93 mg/dL (ref 0.57–1.00)
GFR calc Af Amer: 80 mL/min/{1.73_m2} (ref >59)
GFR calc non Af Amer: 69 mL/min/{1.73_m2} (ref >59)
Globulin, Total: 2.2 g/dL (ref 1.5–4.5)
Glucose: 100 mg/dL — ABNORMAL HIGH (ref 65–99)
Potassium: 5 mmol/L (ref 3.5–5.2)
Sodium: 144 mmol/L (ref 134–144)
Total Protein: 6.7 g/dL (ref 6.0–8.5)

## 2019-04-03 LAB — LIPID PANEL
Chol/HDL Ratio: 4.7 ratio — ABNORMAL HIGH (ref 0.0–4.4)
Cholesterol, Total: 178 mg/dL (ref 100–199)
HDL: 38 mg/dL — ABNORMAL LOW (ref >39)
LDL Chol Calc (NIH): 99 mg/dL (ref 0–99)
Triglycerides: 241 mg/dL — ABNORMAL HIGH (ref 0–149)
VLDL Cholesterol Cal: 41 mg/dL — ABNORMAL HIGH (ref 5–40)

## 2019-04-05 ENCOUNTER — Other Ambulatory Visit: Payer: Self-pay | Admitting: Physician Assistant

## 2019-04-05 DIAGNOSIS — M5136 Other intervertebral disc degeneration, lumbar region: Secondary | ICD-10-CM

## 2019-04-05 DIAGNOSIS — M48062 Spinal stenosis, lumbar region with neurogenic claudication: Secondary | ICD-10-CM

## 2019-04-05 DIAGNOSIS — R202 Paresthesia of skin: Secondary | ICD-10-CM

## 2019-04-13 ENCOUNTER — Ambulatory Visit (HOSPITAL_COMMUNITY)
Admission: RE | Admit: 2019-04-13 | Discharge: 2019-04-13 | Disposition: A | Discharge status: Home / Self Care | Payer: PPO | Source: Ambulatory Visit | Attending: Physician Assistant | Admitting: Physician Assistant

## 2019-04-13 ENCOUNTER — Other Ambulatory Visit: Payer: Self-pay

## 2019-04-13 DIAGNOSIS — M5136 Other intervertebral disc degeneration, lumbar region: Secondary | ICD-10-CM

## 2019-04-13 DIAGNOSIS — R202 Paresthesia of skin: Secondary | ICD-10-CM

## 2019-04-13 DIAGNOSIS — M48062 Spinal stenosis, lumbar region with neurogenic claudication: Secondary | ICD-10-CM

## 2019-05-10 ENCOUNTER — Ambulatory Visit (HOSPITAL_COMMUNITY)
Admission: RE | Admit: 2019-05-10 | Discharge: 2019-05-10 | Disposition: A | Discharge status: Home / Self Care | Payer: PPO | Source: Ambulatory Visit | Attending: Physician Assistant | Admitting: Physician Assistant

## 2019-05-10 ENCOUNTER — Other Ambulatory Visit: Payer: Self-pay

## 2019-05-10 DIAGNOSIS — M5136 Other intervertebral disc degeneration, lumbar region: Secondary | ICD-10-CM | POA: Diagnosis not present

## 2019-05-10 DIAGNOSIS — M48062 Spinal stenosis, lumbar region with neurogenic claudication: Secondary | ICD-10-CM | POA: Insufficient documentation

## 2019-05-10 DIAGNOSIS — R202 Paresthesia of skin: Secondary | ICD-10-CM | POA: Diagnosis not present

## 2019-05-10 DIAGNOSIS — M545 Low back pain: Secondary | ICD-10-CM | POA: Diagnosis not present

## 2019-05-12 ENCOUNTER — Telehealth: Payer: Self-pay | Admitting: Physician Assistant

## 2019-05-12 NOTE — Telephone Encounter (Signed)
Please review MRI.

## 2019-05-18 ENCOUNTER — Other Ambulatory Visit: Payer: Self-pay | Admitting: Physician Assistant

## 2019-05-18 DIAGNOSIS — G8929 Other chronic pain: Secondary | ICD-10-CM

## 2019-05-18 DIAGNOSIS — M5134 Other intervertebral disc degeneration, thoracic region: Secondary | ICD-10-CM

## 2019-05-18 DIAGNOSIS — M48062 Spinal stenosis, lumbar region with neurogenic claudication: Secondary | ICD-10-CM

## 2019-05-18 DIAGNOSIS — M5136 Other intervertebral disc degeneration, lumbar region: Secondary | ICD-10-CM

## 2019-05-18 DIAGNOSIS — M5442 Lumbago with sciatica, left side: Secondary | ICD-10-CM

## 2019-06-01 ENCOUNTER — Ambulatory Visit (INDEPENDENT_AMBULATORY_CARE_PROVIDER_SITE_OTHER): Payer: PPO | Admitting: Family Medicine

## 2019-06-01 ENCOUNTER — Encounter: Payer: Self-pay | Admitting: Family Medicine

## 2019-06-01 DIAGNOSIS — J069 Acute upper respiratory infection, unspecified: Secondary | ICD-10-CM | POA: Diagnosis not present

## 2019-06-01 MED ORDER — BENZONATATE 100 MG PO CAPS: 100.0000 mg | ORAL_CAPSULE | Freq: Three times a day (TID) | ORAL | 0 refills | Status: DC | PRN

## 2019-06-01 MED ORDER — OSELTAMIVIR PHOSPHATE 75 MG PO CAPS: 75.0000 mg | ORAL_CAPSULE | Freq: Two times a day (BID) | ORAL | 0 refills | Status: AC
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
## 2019-06-03 ENCOUNTER — Telehealth: Payer: Self-pay | Admitting: Physician Assistant

## 2019-06-03 DIAGNOSIS — R0902 Hypoxemia: Secondary | ICD-10-CM | POA: Diagnosis not present

## 2019-06-03 DIAGNOSIS — J449 Chronic obstructive pulmonary disease, unspecified: Secondary | ICD-10-CM | POA: Diagnosis not present

## 2019-06-03 NOTE — Telephone Encounter (Signed)
Explained this to patient, she was upset that she could not get an antibiotic and states she will call her PCP, Susan Ward in the am

## 2019-06-03 NOTE — Telephone Encounter (Signed)
Patient states that she does not have any new symptoms.  States that she has improved and only is coughing in the morning.  States that all her pain has improved but now she is having upper back pain.  States that she did not get tested for COVID since this has happened to her before.

## 2019-06-03 NOTE — Telephone Encounter (Signed)
If her respiratory symptoms are improving it sounds like this is a viral illness as initially suspected. It does not sound like she needs an antibiotic. I recommend Tylenol and Ibuprofen for pain. She can take them together for severe pain - Ibuprofen 400-600 mg with Tylenol 500 mg every 6-8 hours as needed.

## 2019-06-03 NOTE — Telephone Encounter (Signed)
Has she developed any other symptoms? Does she feel she is getting worse or is she about the same? Did she go get tested for COVID-19?

## 2019-06-04 ENCOUNTER — Ambulatory Visit (INDEPENDENT_AMBULATORY_CARE_PROVIDER_SITE_OTHER): Payer: PPO | Admitting: *Deleted

## 2019-06-04 ENCOUNTER — Other Ambulatory Visit: Payer: Self-pay

## 2019-06-04 DIAGNOSIS — Z0000 Encounter for general adult medical examination without abnormal findings: Secondary | ICD-10-CM | POA: Diagnosis not present

## 2019-06-04 NOTE — Patient Instructions (Signed)
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
## 2019-06-10 ENCOUNTER — Other Ambulatory Visit: Payer: Self-pay | Admitting: Physician Assistant

## 2019-06-10 DIAGNOSIS — K219 Gastro-esophageal reflux disease without esophagitis: Secondary | ICD-10-CM

## 2019-06-10 DIAGNOSIS — L509 Urticaria, unspecified: Secondary | ICD-10-CM

## 2019-06-28 DIAGNOSIS — M5126 Other intervertebral disc displacement, lumbar region: Secondary | ICD-10-CM | POA: Diagnosis not present

## 2019-06-28 DIAGNOSIS — M48061 Spinal stenosis, lumbar region without neurogenic claudication: Secondary | ICD-10-CM | POA: Diagnosis not present

## 2019-06-28 DIAGNOSIS — R030 Elevated blood-pressure reading, without diagnosis of hypertension: Secondary | ICD-10-CM | POA: Diagnosis not present

## 2019-06-28 DIAGNOSIS — M545 Low back pain: Secondary | ICD-10-CM | POA: Diagnosis not present

## 2019-06-29 DIAGNOSIS — M5416 Radiculopathy, lumbar region: Secondary | ICD-10-CM | POA: Diagnosis not present

## 2019-07-02 ENCOUNTER — Other Ambulatory Visit: Payer: Self-pay

## 2019-07-05 ENCOUNTER — Encounter: Payer: Self-pay | Admitting: Physician Assistant

## 2019-07-05 ENCOUNTER — Other Ambulatory Visit: Payer: Self-pay

## 2019-07-05 ENCOUNTER — Ambulatory Visit: Payer: PPO | Admitting: Physician Assistant

## 2019-07-05 VITALS — BP 139/73 | HR 73 | Temp 98.4°F | Ht 61.0 in | Wt 165.8 lb

## 2019-07-05 DIAGNOSIS — M5136 Other intervertebral disc degeneration, lumbar region: Secondary | ICD-10-CM

## 2019-07-05 DIAGNOSIS — M5442 Lumbago with sciatica, left side: Secondary | ICD-10-CM | POA: Diagnosis not present

## 2019-07-05 DIAGNOSIS — Z1211 Encounter for screening for malignant neoplasm of colon: Secondary | ICD-10-CM

## 2019-07-05 DIAGNOSIS — G8929 Other chronic pain: Secondary | ICD-10-CM

## 2019-07-05 DIAGNOSIS — M48062 Spinal stenosis, lumbar region with neurogenic claudication: Secondary | ICD-10-CM

## 2019-07-05 DIAGNOSIS — E7800 Pure hypercholesterolemia, unspecified: Secondary | ICD-10-CM | POA: Diagnosis not present

## 2019-07-05 DIAGNOSIS — M5441 Lumbago with sciatica, right side: Secondary | ICD-10-CM | POA: Diagnosis not present

## 2019-07-05 DIAGNOSIS — Z79899 Other long term (current) drug therapy: Secondary | ICD-10-CM

## 2019-07-05 DIAGNOSIS — J449 Chronic obstructive pulmonary disease, unspecified: Secondary | ICD-10-CM

## 2019-07-05 DIAGNOSIS — M5134 Other intervertebral disc degeneration, thoracic region: Secondary | ICD-10-CM

## 2019-07-05 MED ORDER — HYDROCODONE-ACETAMINOPHEN 10-325 MG PO TABS: 1.0000 | ORAL_TABLET | ORAL | 0 refills | Status: DC | PRN

## 2019-07-05 MED ORDER — KETOROLAC TROMETHAMINE 60 MG/2ML IM SOLN
60.0000 mg | Freq: Once | INTRAMUSCULAR | Status: AC
  Administered 2019-07-05: 60 mg via INTRAMUSCULAR

## 2019-07-05 MED ORDER — ALBUTEROL SULFATE 0.63 MG/3ML IN NEBU: 1.0000 | INHALATION_SOLUTION | Freq: Four times a day (QID) | RESPIRATORY_TRACT | 12 refills | Status: AC | PRN

## 2019-07-07 LAB — TOXASSURE SELECT 13 (MW), URINE
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
## 2019-07-09 ENCOUNTER — Other Ambulatory Visit: Payer: Self-pay | Admitting: Neurosurgery

## 2019-07-14 ENCOUNTER — Telehealth: Payer: Self-pay | Admitting: Physician Assistant

## 2019-07-14 NOTE — H&P (Signed)
Patient ID:   [id number] Patient: Susan Ward  Date of Birth: 09-Nov-1965 Visit Type: Office Visit   Date: 06/28/2019 10:30 AM Provider: Marchia Meiers. Vertell Limber MD   This 54 year old female presents for back pain.  HISTORY OF PRESENT ILLNESS:  1.  back pain  Susan Ward, 54 year old female, visits and transfer of care from Dr. Annette Stable.  She was last seen in 2017 at which time Dr. Annette Stable recommended a right L3-4 microdiskectomy.  Currently, patient reports midback pain at the level of her bra strap, low back pain, and right greater than left buttock pain.  She also notes pain into both legs to the feet.  Symptoms been increasing over the past 6 months.  She requires a cane for ambulation at times.  Gabapentin 300 mg b.i.d. Norco 10/325 5-6 tabs per day Cymbalta 60 mg b.i.d. Medications are prescribed by Particia Nearing PA-C at [facility] family medicine  History:  Asthma, bipolar disorder, GERD, depression, migraines Surgical history:  Right T10-11 microdiskectomy December 2009 by Dr. Annette Stable, left L4-5 microdiskectomy November 2003 by Dr. Annette Stable  (incoming correspondence alludes to "5 back surgeries" but patient is unable to recall how many/types surgeries she has undergone.)  Lumbar MRI May 10, 2019 on canopy   the patient is describing 9/10 pain which it time she says goes up to 15 to 16/10.  She is a 1 pack-per-day smoker.  She complains of pain going from her low back into both of her legs and across her buttocks with severe pain in the right hip.  Her MRI imaging demonstrates a recurrent disc herniation at L4-5 on the left and also with a disc herniation at L3-4 on the right.          PAST MEDICAL/SURGICAL HISTORY:   (Reviewed, updated)    Disease/disorder Onset Date Management Date Comments    Hysterectomy, total  RRM 04/21/2014 -  High cholesterol    RRM 04/21/2014 -    Back surgeries (3)       PAST MEDICAL HISTORY, SURGICAL HISTORY, FAMILY HISTORY, SOCIAL HISTORY AND REVIEW  OF SYSTEMS I have reviewed the patient's past medical, surgical, family and social history as well as the comprehensive review of systems as included on the [facility] history form dated 06/28/2019, which I have signed.  Family History:  (Reviewed, updated) Relationship Family Member Name Deceased Age at Death Condition Onset Age Cause of Death      Family history of Diabetes mellitus  N      Family history of Cancer  N      Family history of Hypertension  N  Father    COPD  N  Father    Diabetes mellitus  N  Father    Hypertension  N  Father    Cancer, unknown  N  Mother    Cancer, unknown  N     Social History:  (Reviewed, updated) Tobacco use reviewed. Preferred language is Unknown.   Tobacco use status: Heavy cigarette smoker (20-39 cigs/day). Smoking status: Heavy tobacco smoker.  SMOKING STATUS Type Smoking Status Usage Per Day Years Used Total Pack Years  Cigarette Heavy tobacco smoker 1 Packs     TOBACCO CESSATION INFORMATION Date Counseled By Order Status Description Code Tobacco Cessation Information  04/21/2014      Smoking cessation education       MEDICATIONS: (added, continued or stopped this visit) Started Medication Directions Instruction Stopped   atorvastatin 20 mg tablet take 1 tablet  by oral route  every day  07/02/2019   atorvastatin 40 mg tablet      Benadryl 25 mg capsule take 1 capsule by oral route  every day     cetirizine 10 mg tablet      duloxetine 60 mg capsule,delayed release      famotidine 40 mg tablet      fenofibrate micronized 134 mg capsule take 1 capsule by oral route  every day with food     gabapentin 300 mg capsule      hydrocodone 10 mg-acetaminophen 325 mg tablet      omeprazole 20 mg tablet,delayed release   07/02/2019   omeprazole 40 mg capsule,delayed release      oxycodone-acetaminophen 10 mg-325 mg tablet take 1 tablet by oral route  every 6 hours as needed     tizanidine 4 mg capsule take 1  capsule by oral route 4 times every day     topiramate 100 mg tablet        ALLERGIES: Ingredient Reaction Medication Name Comment  ASPIRIN     PENICILLINS     OXYCODONE      Reviewed, updated.   REVIEW OF SYSTEMS   See scanned patient registration form, dated 06/28/2019, signed and dated on 06/28/2019  Review of Systems Details System Neg/Pos Details  Constitutional Negative Chills, Fatigue, Fever, Malaise, Night sweats, Weight gain and Weight loss.  ENMT Negative Ear drainage, Hearing loss, Nasal drainage, Otalgia, Sinus pressure and Sore throat.  Eyes Negative Eye discharge, Eye pain and Vision changes.  Respiratory Negative Chronic cough, Cough, Dyspnea, Known TB exposure and Wheezing.  Cardio Negative Chest pain, Claudication, Edema and Irregular heartbeat/palpitations.  GI Negative Abdominal pain, Blood in stool, Change in stool pattern, Constipation, Decreased appetite, Diarrhea, Heartburn, Nausea and Vomiting.  GU Negative Dysuria, Hematuria, Polyuria (Genitourinary), Urinary frequency, Urinary incontinence and Urinary retention.  Endocrine Negative Cold intolerance, Heat intolerance, Polydipsia and Polyphagia.  Neuro Negative Dizziness, Extremity weakness, Gait disturbance, Headache, Memory impairment, Numbness in extremity, Seizures and Tremors.  Psych Positive Depression.  Integumentary Negative Brittle hair, Brittle nails, Change in shape/size of mole(s), Hair loss, Hirsutism, Hives, Pruritus, Rash and Skin lesion.  MS Positive Back pain.  Hema/Lymph Negative Easy bleeding, Easy bruising and Lymphadenopathy.  Allergic/Immuno Negative Contact allergy, Environmental allergies, Food allergies and Seasonal allergies.  Reproductive Negative Breast discharge, Breast lumps, Dysmenorrhea, Dyspareunia, History of abnormal PAP smear, Hot flashes, Irregular menses and Vaginal discharge.   PHYSICAL EXAM:   Vitals Date Temp F BP Pulse Ht In Wt Lb BMI BSA Pain Score  06/28/2019  97.3 152/76 70 65 165.4 27.52  4/10    PHYSICAL EXAM Details General Level of Distress: no acute distress Overall Appearance: normal  Head and Face  Right Left  Fundoscopic Exam:  normal normal    Cardiovascular Cardiac: regular rate and rhythm without murmur  Right Left  Carotid Pulses: normal normal  Respiratory Lungs: clear to auscultation  Neurological Orientation: normal Recent and Remote Memory: normal Attention Span and Concentration:   normal Language: normal Fund of Knowledge: normal  Right Left Sensation: normal normal Upper Extremity Coordination: normal normal  Lower Extremity Coordination: normal normal  Musculoskeletal Gait and Station: normal  Right Left Upper Extremity Muscle Strength: normal normal Lower Extremity Muscle Strength: normal normal Upper Extremity Muscle Tone:  normal normal Lower Extremity Muscle Tone: normal normal   Motor Strength Upper and lower extremity motor strength was tested in the clinically pertinent muscles. Any abnormal findings will  be noted below.   Right Left Knee Extensor: 4/5  EHL:  4/5   Deep Tendon Reflexes  Right Left Biceps: normal normal Triceps: normal normal Brachioradialis: normal normal Patellar: normal normal Achilles: normal normal  Sensory Sensation was tested at L1 to S1.   Cranial Nerves II. Optic Nerve/Visual Fields: normal III. Oculomotor: normal IV. Trochlear: normal V. Trigeminal: normal VI. Abducens: normal VII. Facial: normal VIII. Acoustic/Vestibular: normal IX. Glossopharyngeal: normal X. Vagus: normal XI. Spinal Accessory: normal XII. Hypoglossal: normal  Motor and other Tests Lhermittes: negative Rhomberg: negative Pronator drift: absent     Right Left Hoffman's: normal normal Clonus: normal normal Babinski: normal normal SLR: negative negative Patrick's Corky Sox): negative negative Toe Walk: normal normal Toe Lift: normal normal Heel  Walk: normal normal Tinels Elbow: negative negative Tinels Wrist: negative negative SI Joint: nontender nontender Phalen: negative negative   Additional Findings:   patient is unable to stand on her heel on the left and unable to perform a dip on the right    IMPRESSION:     The patient has severe recurrent spinal stenosis at L4-5 left greater than right and a disc herniation at L3-4 on the right.  I have advised her to stop smoking.  I have recommended redo decompression  with fusion at the L3-4 and L4-5 levels with TLIF at L3-4 on the right and L4-5 on the left.  PLAN:   the patient wishes to proceed with surgery.  She has been advised the importance of stopping smoking.  She wishes to proceed as soon as possible.  Surgery will consist of right L3-4 and left L4-5 TLIF with pedicle screw fixation. the risks and benefits were discussed in detail with the patient and she wishes to proceed with surgery  Orders: Diagnostic Procedures: Assessment Procedure  M48.061 Lumbar Spine- AP/Lat  Instruction(s)/Education: Assessment Instruction  R03.0 Lifestyle education  Z68.27 Dietary management education, guidance, and counseling  Miscellaneous: Assessment   M48.061 LSO Brace   Completed Orders (this encounter) Order Details Reason Side Interpretation Result Initial Treatment Date Region  Lifestyle education Patient will monitor and contact primary care physician if needed.        Dietary management education, guidance, and counseling Encouraged patient to eat well balanced diet.         Assessment/Plan   # Detail Type Description   1. Assessment Low back pain, unspecified back pain laterality, with sciatica presence unspecified (M54.5).       2. Assessment Herniated nucleus pulposus, lumbar (M51.26).       3. Assessment Degenerative lumbar spinal stenosis (M48.061).   Plan Orders LSO Brace.       4. Assessment Elevated blood-pressure reading, w/o diagnosis of htn (R03.0).        5. Assessment Body mass index (BMI) 27.0-27.9, adult [id number]).   Plan Orders Today's instructions / counseling include(s) Dietary management education, guidance, and counseling. Clinical information/comments: Encouraged patient to eat well balanced diet.         Pain Management Plan Pain Scale: 4/10. Method: Numeric Pain Intensity Scale. Location: back. Onset: 03/24/2014. Duration: varies. Quality: discomforting. Pain management follow-up plan of care: Patient will continue medication management..              Provider:  Marchia Meiers. Vertell Limber MD  07/04/2019 01:23 PM    Dictation edited by: Marchia Meiers. Vertell Limber    CC Providers: Particia Nearing [facility] [address] [place],  [place]  [id number]   Angel Jones  [place], [place] [id number]-               Electronically signed by Marchia Meiers Vertell Limber MD on 07/04/2019 01:23 PM

## 2019-07-15 NOTE — Telephone Encounter (Signed)
Message left for patient

## 2019-07-29 NOTE — Progress Notes (Signed)
[facility] [place], [place] [id number] Phone: [phone] Fax: [phone]      Your procedure is scheduled on Tuesday, August 03, 2019.  Report to [facility] Main Entrance "A" at 10:50 A.M., and check in at the Admitting office.  Call this number if you have problems the morning of surgery:  [phone]  Call [phone] if you have any questions prior to your surgery date Monday-Friday 8am-4pm    Remember:  Do not eat or drink after midnight the night before your surgery    Take these medicines the morning of surgery with A SIP OF WATER: cetirizine (ZYRTEC) DULoxetine (CYMBALTA)  famotidine (PEPCID) gabapentin (NEURONTIN) omeprazole (PRILOSEC) topiramate (TOPAMAX) albuterol (PROVENTIL HFA;VENTOLIN HFA) - if needed  Please bring all inhaler with you the day of surgery.   7 days prior to surgery STOP taking any Aspirin (unless otherwise instructed by your surgeon), Aleve, Naproxen, Ibuprofen, Motrin, Advil, Goody's, BC's, all herbal medications, fish oil, and all vitamins.    The Morning of Surgery  Do not wear jewelry, make-up or nail polish.  Do not wear lotions, powders, perfumes, or deodorant  Do not shave 48 hours prior to surgery.  Do not bring valuables to the hospital.  [facility] is not responsible for any belongings or valuables.  If you are a smoker, DO NOT Smoke 24 hours prior to surgery  If you wear a CPAP at night please bring your mask the morning of surgery   Remember that you must have someone to transport you home after your surgery, and remain with you for 24 hours if you are discharged the same day.   Please bring cases for contacts, glasses, hearing aids, dentures or bridgework because it cannot be worn into surgery.    Leave your suitcase in the car.  After surgery it may be brought to your room.  For patients admitted to the hospital, discharge time will be determined by your  treatment team.  Patients discharged the day of surgery will not be allowed to drive home.    Special instructions:   [place]- Preparing For Surgery  Before surgery, you can play an important role. Because skin is not sterile, your skin needs to be as free of germs as possible. You can reduce the number of germs on your skin by washing with CHG (chlorahexidine gluconate) Soap before surgery.  CHG is an antiseptic cleaner which kills germs and bonds with the skin to continue killing germs even after washing.    Oral Hygiene is also important to reduce your risk of infection.  Remember - BRUSH YOUR TEETH THE MORNING OF SURGERY WITH YOUR REGULAR TOOTHPASTE  Please do not use if you have an allergy to CHG or antibacterial soaps. If your skin becomes reddened/irritated stop using the CHG.  Do not shave (including legs and underarms) for at least 48 hours prior to first CHG shower. It is OK to shave your face.  Please follow these instructions carefully.   1. Shower the NIGHT BEFORE SURGERY and the MORNING OF SURGERY with CHG Soap.   2. If you chose to wash your hair, wash your hair first as usual with your normal shampoo.  3. After you shampoo, rinse your hair and body thoroughly to remove the shampoo.  4. Use CHG as you would any other liquid soap. You can apply CHG directly to the skin and wash gently with a scrungie or a clean  washcloth.   5. Apply the CHG Soap to your body ONLY FROM THE NECK DOWN.  Do not use on open wounds or open sores. Avoid contact with your eyes, ears, mouth and genitals (private parts). Wash Face and genitals (private parts)  with your normal soap.   6. Wash thoroughly, paying special attention to the area where your surgery will be performed.  7. Thoroughly rinse your body with warm water from the neck down.  8. DO NOT shower/wash with your normal soap after using and rinsing off the CHG Soap.  9. Pat yourself dry with a CLEAN TOWEL.  10. Wear CLEAN  PAJAMAS to bed the night before surgery, wear comfortable clothes the morning of surgery  11. Place CLEAN SHEETS on your bed the night of your first shower and DO NOT SLEEP WITH PETS.    Day of Surgery:  Please shower the morning of surgery with the CHG soap Do not apply any deodorants/lotions. Please wear clean clothes to the hospital/surgery center.   Remember to brush your teeth WITH YOUR REGULAR TOOTHPASTE.   Please read over the following fact sheets that you were given.

## 2019-07-30 ENCOUNTER — Encounter (HOSPITAL_COMMUNITY): Payer: Self-pay

## 2019-07-30 ENCOUNTER — Encounter (HOSPITAL_COMMUNITY)
Admission: RE | Admit: 2019-07-30 | Discharge: 2019-07-30 | Disposition: A | Discharge status: Home / Self Care | Payer: PPO | Source: Ambulatory Visit | Attending: Neurosurgery | Admitting: Neurosurgery

## 2019-07-30 ENCOUNTER — Other Ambulatory Visit (HOSPITAL_COMMUNITY)
Admission: RE | Admit: 2019-07-30 | Discharge: 2019-07-30 | Disposition: A | Discharge status: Home / Self Care | Payer: PPO | Source: Ambulatory Visit | Attending: Neurosurgery | Admitting: Neurosurgery

## 2019-07-30 ENCOUNTER — Other Ambulatory Visit: Payer: Self-pay

## 2019-07-30 DIAGNOSIS — Z01812 Encounter for preprocedural laboratory examination: Secondary | ICD-10-CM | POA: Diagnosis not present

## 2019-07-30 DIAGNOSIS — Z20822 Contact with and (suspected) exposure to covid-19: Secondary | ICD-10-CM | POA: Diagnosis not present

## 2019-07-30 HISTORY — DX: Anxiety disorder, unspecified: F41.9

## 2019-07-30 HISTORY — DX: Sleep apnea, unspecified: G47.30

## 2019-07-30 HISTORY — DX: Chronic obstructive pulmonary disease, unspecified: J44.9

## 2019-07-30 LAB — SURGICAL PCR SCREEN
MRSA, PCR: NEGATIVE
Staphylococcus aureus: NEGATIVE

## 2019-07-30 LAB — CBC
HCT: 44.6 % (ref 36.0–46.0)
Hemoglobin: 14.6 g/dL (ref 12.0–15.0)
MCH: 31 pg (ref 26.0–34.0)
MCHC: 32.7 g/dL (ref 30.0–36.0)
MCV: 94.7 fL (ref 80.0–100.0)
Platelets: 214 10*3/uL (ref 150–400)
RBC: 4.71 MIL/uL (ref 3.87–5.11)
RDW: 12 % (ref 11.5–15.5)
WBC: 9.8 10*3/uL (ref 4.0–10.5)
nRBC: 0 % (ref 0.0–0.2)

## 2019-07-30 LAB — SARS CORONAVIRUS 2 (TAT 6-24 HRS): SARS Coronavirus 2: NEGATIVE

## 2019-07-30 LAB — TYPE AND SCREEN
ABO/RH(D): O POS
Antibody Screen: NEGATIVE

## 2019-07-30 NOTE — Progress Notes (Signed)
PCP - Dr. Particia Nearing Cardiologist - Denies  PPM/ICD - Denies  Chest x-ray - N/A EKG - denies Stress Test - Denies  ECHO - Denies Cardiac Cath - Denies  Sleep Study - Per pt, recalls having sleep study done maybe 5 years ago and was diagnosed w/ sleep apnea. CPAP - Denies  Pt denies being diabetic.  Blood Thinner Instructions: N/A Aspirin Instructions: N/A  ERAS Protcol - No  COVID TEST- 07/30/19   Coronavirus Screening  Have you experienced the following symptoms:  Cough yes/no: No Fever (>100.32F)  yes/no: No Runny nose yes/no: No Sore throat yes/no: No Difficulty breathing/shortness of breath  yes/no: No  Have you or a family member traveled in the last 14 days and where? yes/no: No   If the patient indicates "YES" to the above questions, their PAT will be rescheduled to limit the exposure to others and, the surgeon will be notified. THE PATIENT WILL NEED TO BE ASYMPTOMATIC FOR 14 DAYS.   If the patient is not experiencing any of these symptoms, the PAT nurse will instruct them to NOT bring anyone with them to their appointment since they may have these symptoms or traveled as well.   Please remind your patients and families that hospital visitation restrictions are in effect and the importance of the restrictions.    Anesthesia review: Yes, hx COPD; records requested for sleep study.  Patient denies shortness of breath, fever, cough and chest pain at PAT appointment   All instructions explained to the patient, with a verbal understanding of the material. Patient agrees to go over the instructions while at home for a better understanding. Patient also instructed to self quarantine after being tested for COVID-19. The opportunity to ask questions was provided.

## 2019-08-02 NOTE — Anesthesia Preprocedure Evaluation (Addendum)
Anesthesia Evaluation  Patient identified by MRN, date of birth, ID band Patient awake    Reviewed: Allergy & Precautions, NPO status   Airway Mallampati: II  TM Distance: >3 FB     Dental   Pulmonary Current Smoker,    breath sounds clear to auscultation       Cardiovascular negative cardio ROS   Rhythm:Regular Rate:Normal     Neuro/Psych    GI/Hepatic Neg liver ROS, GERD  ,  Endo/Other  negative endocrine ROS  Renal/GU negative Renal ROS     Musculoskeletal   Abdominal   Peds  Hematology   Anesthesia Other Findings   Reproductive/Obstetrics                            Anesthesia Physical Anesthesia Plan  ASA: III  Anesthesia Plan: General   Post-op Pain Management:    Induction: Intravenous  PONV Risk Score and Plan: 2 and Ondansetron, Dexamethasone and Midazolam  Airway Management Planned: Oral ETT  Additional Equipment:   Intra-op Plan:   Post-operative Plan: Possible Post-op intubation/ventilation  Informed Consent: I have reviewed the patients History and Physical, chart, labs and discussed the procedure including the risks, benefits and alternatives for the proposed anesthesia with the patient or authorized representative who has indicated his/her understanding and acceptance.     Dental advisory given  Plan Discussed with: Anesthesiologist and CRNA  Anesthesia Plan Comments:        Anesthesia Quick Evaluation

## 2019-08-03 ENCOUNTER — Inpatient Hospital Stay (HOSPITAL_COMMUNITY): Payer: PPO

## 2019-08-03 ENCOUNTER — Inpatient Hospital Stay (HOSPITAL_COMMUNITY): Payer: PPO | Admitting: Physician Assistant

## 2019-08-03 ENCOUNTER — Inpatient Hospital Stay (HOSPITAL_COMMUNITY)
Admission: RE | Admit: 2019-08-03 | Discharge: 2019-08-04 | DRG: 455 | Disposition: A | Discharge status: Home / Self Care | Payer: PPO | Attending: Neurosurgery | Admitting: Neurosurgery

## 2019-08-03 ENCOUNTER — Inpatient Hospital Stay (HOSPITAL_COMMUNITY): Payer: PPO | Admitting: Anesthesiology

## 2019-08-03 ENCOUNTER — Encounter (HOSPITAL_COMMUNITY)
Admission: RE | Disposition: A | Discharge status: Home / Self Care | Payer: Self-pay | Source: Home / Self Care | Attending: Neurosurgery

## 2019-08-03 ENCOUNTER — Other Ambulatory Visit: Payer: Self-pay

## 2019-08-03 ENCOUNTER — Encounter (HOSPITAL_COMMUNITY): Payer: Self-pay | Admitting: Neurosurgery

## 2019-08-03 DIAGNOSIS — K219 Gastro-esophageal reflux disease without esophagitis: Secondary | ICD-10-CM | POA: Diagnosis not present

## 2019-08-03 DIAGNOSIS — M48061 Spinal stenosis, lumbar region without neurogenic claudication: Secondary | ICD-10-CM | POA: Diagnosis not present

## 2019-08-03 DIAGNOSIS — J45909 Unspecified asthma, uncomplicated: Secondary | ICD-10-CM | POA: Diagnosis present

## 2019-08-03 DIAGNOSIS — Z981 Arthrodesis status: Secondary | ICD-10-CM | POA: Diagnosis not present

## 2019-08-03 DIAGNOSIS — M48062 Spinal stenosis, lumbar region with neurogenic claudication: Secondary | ICD-10-CM | POA: Diagnosis not present

## 2019-08-03 DIAGNOSIS — Z20822 Contact with and (suspected) exposure to covid-19: Secondary | ICD-10-CM | POA: Diagnosis not present

## 2019-08-03 DIAGNOSIS — G43909 Migraine, unspecified, not intractable, without status migrainosus: Secondary | ICD-10-CM | POA: Diagnosis not present

## 2019-08-03 DIAGNOSIS — Z419 Encounter for procedure for purposes other than remedying health state, unspecified: Secondary | ICD-10-CM

## 2019-08-03 DIAGNOSIS — M5116 Intervertebral disc disorders with radiculopathy, lumbar region: Secondary | ICD-10-CM | POA: Diagnosis present

## 2019-08-03 DIAGNOSIS — Z4889 Encounter for other specified surgical aftercare: Secondary | ICD-10-CM | POA: Diagnosis not present

## 2019-08-03 DIAGNOSIS — F319 Bipolar disorder, unspecified: Secondary | ICD-10-CM | POA: Diagnosis not present

## 2019-08-03 DIAGNOSIS — E7800 Pure hypercholesterolemia, unspecified: Secondary | ICD-10-CM | POA: Diagnosis not present

## 2019-08-03 DIAGNOSIS — M5136 Other intervertebral disc degeneration, lumbar region: Secondary | ICD-10-CM | POA: Diagnosis not present

## 2019-08-03 DIAGNOSIS — F17210 Nicotine dependence, cigarettes, uncomplicated: Secondary | ICD-10-CM | POA: Diagnosis not present

## 2019-08-03 DIAGNOSIS — M4326 Fusion of spine, lumbar region: Secondary | ICD-10-CM | POA: Diagnosis not present

## 2019-08-03 DIAGNOSIS — M545 Low back pain: Secondary | ICD-10-CM | POA: Diagnosis present

## 2019-08-03 DIAGNOSIS — Z90710 Acquired absence of both cervix and uterus: Secondary | ICD-10-CM

## 2019-08-03 HISTORY — DX: Other complications of anesthesia, initial encounter: T88.59XA

## 2019-08-03 HISTORY — PX: TRANSFORAMINAL LUMBAR INTERBODY FUSION (TLIF) WITH PEDICLE SCREW FIXATION 2 LEVEL: SHX6142

## 2019-08-03 SURGERY — TRANSFORAMINAL LUMBAR INTERBODY FUSION (TLIF) WITH PEDICLE SCREW FIXATION 2 LEVEL
Anesthesia: General | Site: Spine Lumbar | Laterality: Bilateral

## 2019-08-03 MED ORDER — ALBUTEROL SULFATE (2.5 MG/3ML) 0.083% IN NEBU: 2.5000 mg | INHALATION_SOLUTION | Freq: Four times a day (QID) | RESPIRATORY_TRACT | Status: DC | PRN

## 2019-08-03 MED ORDER — FLUTICASONE PROPIONATE 50 MCG/ACT NA SUSP
1.0000 | Freq: Every evening | NASAL | Status: DC | PRN
  Filled 2019-08-03: qty 16

## 2019-08-03 MED ORDER — BUPIVACAINE HCL (PF) 0.5 % IJ SOLN
INTRAMUSCULAR | Status: DC | PRN
  Administered 2019-08-03: 5 mL

## 2019-08-03 MED ORDER — LORATADINE 10 MG PO TABS
10.0000 mg | ORAL_TABLET | Freq: Every day | ORAL | Status: DC
  Administered 2019-08-04: 10:00:00 10 mg via ORAL
  Filled 2019-08-03: qty 1

## 2019-08-03 MED ORDER — FENTANYL CITRATE (PF) 100 MCG/2ML IJ SOLN: 25.0000 ug | INTRAMUSCULAR | Status: DC | PRN

## 2019-08-03 MED ORDER — ONDANSETRON HCL 4 MG/2ML IJ SOLN: 4.0000 mg | Freq: Four times a day (QID) | INTRAMUSCULAR | Status: DC | PRN

## 2019-08-03 MED ORDER — PANTOPRAZOLE SODIUM 40 MG PO TBEC
40.0000 mg | DELAYED_RELEASE_TABLET | Freq: Every day | ORAL | Status: DC
  Administered 2019-08-04: 08:00:00 40 mg via ORAL
  Filled 2019-08-03: qty 1

## 2019-08-03 MED ORDER — LIDOCAINE-EPINEPHRINE 1 %-1:100000 IJ SOLN
INTRAMUSCULAR | Status: AC
  Filled 2019-08-03: qty 1

## 2019-08-03 MED ORDER — ONDANSETRON HCL 4 MG PO TABS: 4.0000 mg | ORAL_TABLET | Freq: Four times a day (QID) | ORAL | Status: DC | PRN

## 2019-08-03 MED ORDER — VANCOMYCIN HCL IN DEXTROSE 1-5 GM/200ML-% IV SOLN
1000.0000 mg | Freq: Once | INTRAVENOUS | Status: AC
  Administered 2019-08-03: 19:00:00 1000 mg via INTRAVENOUS
  Filled 2019-08-03: qty 200

## 2019-08-03 MED ORDER — DEXAMETHASONE SODIUM PHOSPHATE 10 MG/ML IJ SOLN
INTRAMUSCULAR | Status: DC | PRN
  Administered 2019-08-03: 10 mg via INTRAVENOUS

## 2019-08-03 MED ORDER — SODIUM CHLORIDE 0.9% FLUSH
3.0000 mL | Freq: Two times a day (BID) | INTRAVENOUS | Status: DC
  Administered 2019-08-03: 17:00:00 3 mL via INTRAVENOUS

## 2019-08-03 MED ORDER — SODIUM CHLORIDE 0.9% FLUSH: 3.0000 mL | INTRAVENOUS | Status: DC | PRN

## 2019-08-03 MED ORDER — FENTANYL CITRATE (PF) 250 MCG/5ML IJ SOLN
INTRAMUSCULAR | Status: DC | PRN
  Administered 2019-08-03: 150 ug via INTRAVENOUS

## 2019-08-03 MED ORDER — METHOCARBAMOL 1000 MG/10ML IJ SOLN
500.0000 mg | Freq: Four times a day (QID) | INTRAVENOUS | Status: DC | PRN
  Filled 2019-08-03: qty 5

## 2019-08-03 MED ORDER — POLYETHYLENE GLYCOL 3350 17 G PO PACK: 17.0000 g | PACK | Freq: Every day | ORAL | Status: DC | PRN

## 2019-08-03 MED ORDER — ROCURONIUM BROMIDE 10 MG/ML (PF) SYRINGE
PREFILLED_SYRINGE | INTRAVENOUS | Status: DC | PRN
  Administered 2019-08-03: 60 mg via INTRAVENOUS
  Administered 2019-08-03: 10 mg via INTRAVENOUS

## 2019-08-03 MED ORDER — CHLORHEXIDINE GLUCONATE CLOTH 2 % EX PADS: 6.0000 | MEDICATED_PAD | Freq: Once | CUTANEOUS | Status: DC

## 2019-08-03 MED ORDER — ZOLPIDEM TARTRATE 5 MG PO TABS: 5.0000 mg | ORAL_TABLET | Freq: Every evening | ORAL | Status: DC | PRN

## 2019-08-03 MED ORDER — HYDROCODONE-ACETAMINOPHEN 10-325 MG PO TABS
1.0000 | ORAL_TABLET | ORAL | Status: DC | PRN
  Administered 2019-08-03 – 2019-08-04 (×3): 2 via ORAL
  Filled 2019-08-03 (×3): qty 2

## 2019-08-03 MED ORDER — ACETAMINOPHEN 650 MG RE SUPP: 650.0000 mg | RECTAL | Status: DC | PRN

## 2019-08-03 MED ORDER — HYDROMORPHONE HCL 1 MG/ML IJ SOLN: 0.5000 mg | INTRAMUSCULAR | Status: DC | PRN

## 2019-08-03 MED ORDER — SUGAMMADEX SODIUM 200 MG/2ML IV SOLN
INTRAVENOUS | Status: DC | PRN
  Administered 2019-08-03: 150 mg via INTRAVENOUS

## 2019-08-03 MED ORDER — LINACLOTIDE 145 MCG PO CAPS
145.0000 ug | ORAL_CAPSULE | Freq: Every day | ORAL | Status: DC | PRN
  Filled 2019-08-03: qty 1

## 2019-08-03 MED ORDER — METHOCARBAMOL 500 MG PO TABS
500.0000 mg | ORAL_TABLET | Freq: Four times a day (QID) | ORAL | Status: DC | PRN
  Administered 2019-08-03 – 2019-08-04 (×3): 500 mg via ORAL
  Filled 2019-08-03 (×3): qty 1

## 2019-08-03 MED ORDER — LIDOCAINE-EPINEPHRINE 1 %-1:100000 IJ SOLN
INTRAMUSCULAR | Status: DC | PRN
  Administered 2019-08-03: 5 mL

## 2019-08-03 MED ORDER — ROCURONIUM BROMIDE 10 MG/ML (PF) SYRINGE
PREFILLED_SYRINGE | INTRAVENOUS | Status: AC
  Filled 2019-08-03: qty 10

## 2019-08-03 MED ORDER — BUPIVACAINE HCL (PF) 0.5 % IJ SOLN
INTRAMUSCULAR | Status: AC
  Filled 2019-08-03: qty 30

## 2019-08-03 MED ORDER — THROMBIN 5000 UNITS EX SOLR
CUTANEOUS | Status: AC
  Filled 2019-08-03: qty 5000

## 2019-08-03 MED ORDER — DULOXETINE HCL 60 MG PO CPEP
60.0000 mg | ORAL_CAPSULE | Freq: Two times a day (BID) | ORAL | Status: DC
  Administered 2019-08-03 – 2019-08-04 (×2): 60 mg via ORAL
  Filled 2019-08-03 (×2): qty 1
  Filled 2019-08-03 (×2): qty 2
  Filled 2019-08-03: qty 1

## 2019-08-03 MED ORDER — PROPOFOL 10 MG/ML IV BOLUS
INTRAVENOUS | Status: AC
  Filled 2019-08-03: qty 20

## 2019-08-03 MED ORDER — FLEET ENEMA 7-19 GM/118ML RE ENEM: 1.0000 | ENEMA | Freq: Once | RECTAL | Status: DC | PRN

## 2019-08-03 MED ORDER — PHENYLEPHRINE HCL-NACL 10-0.9 MG/250ML-% IV SOLN
INTRAVENOUS | Status: DC | PRN
  Administered 2019-08-03: 25 ug/min via INTRAVENOUS

## 2019-08-03 MED ORDER — THROMBIN 5000 UNITS EX SOLR
OROMUCOSAL | Status: DC | PRN
  Administered 2019-08-03: 5 mL via TOPICAL

## 2019-08-03 MED ORDER — HYDROCODONE-ACETAMINOPHEN 10-325 MG PO TABS: 1.0000 | ORAL_TABLET | ORAL | Status: DC | PRN

## 2019-08-03 MED ORDER — FENTANYL CITRATE (PF) 250 MCG/5ML IJ SOLN
INTRAMUSCULAR | Status: AC
  Filled 2019-08-03: qty 5

## 2019-08-03 MED ORDER — PHENYLEPHRINE 40 MCG/ML (10ML) SYRINGE FOR IV PUSH (FOR BLOOD PRESSURE SUPPORT)
PREFILLED_SYRINGE | INTRAVENOUS | Status: AC
  Filled 2019-08-03: qty 10

## 2019-08-03 MED ORDER — VANCOMYCIN HCL IN DEXTROSE 1-5 GM/200ML-% IV SOLN
INTRAVENOUS | Status: AC
  Administered 2019-08-03: 08:00:00 1000 mg via INTRAVENOUS
  Filled 2019-08-03: qty 200

## 2019-08-03 MED ORDER — ONDANSETRON HCL 4 MG/2ML IJ SOLN
INTRAMUSCULAR | Status: AC
  Filled 2019-08-03: qty 2

## 2019-08-03 MED ORDER — SODIUM CHLORIDE 0.9 % IV SOLN: 250.0000 mL | INTRAVENOUS | Status: DC

## 2019-08-03 MED ORDER — FAMOTIDINE 20 MG PO TABS
20.0000 mg | ORAL_TABLET | Freq: Every day | ORAL | Status: DC
  Administered 2019-08-04: 08:00:00 20 mg via ORAL
  Filled 2019-08-03: qty 1

## 2019-08-03 MED ORDER — ATORVASTATIN CALCIUM 40 MG PO TABS
40.0000 mg | ORAL_TABLET | Freq: Every day | ORAL | Status: DC
  Administered 2019-08-03: 21:00:00 40 mg via ORAL
  Filled 2019-08-03: qty 1

## 2019-08-03 MED ORDER — MIDAZOLAM HCL 2 MG/2ML IJ SOLN
INTRAMUSCULAR | Status: AC
  Filled 2019-08-03: qty 2

## 2019-08-03 MED ORDER — LACTATED RINGERS IV SOLN: INTRAVENOUS | Status: DC

## 2019-08-03 MED ORDER — ACETAMINOPHEN 325 MG PO TABS: 650.0000 mg | ORAL_TABLET | ORAL | Status: DC | PRN

## 2019-08-03 MED ORDER — BUPIVACAINE LIPOSOME 1.3 % IJ SUSP
INTRAMUSCULAR | Status: DC | PRN
  Administered 2019-08-03: 20 mL

## 2019-08-03 MED ORDER — THROMBIN 20000 UNITS EX SOLR
CUTANEOUS | Status: DC | PRN
  Administered 2019-08-03: 20 mL via TOPICAL

## 2019-08-03 MED ORDER — THROMBIN 20000 UNITS EX SOLR
CUTANEOUS | Status: AC
  Filled 2019-08-03: qty 20000

## 2019-08-03 MED ORDER — GABAPENTIN 300 MG PO CAPS
900.0000 mg | ORAL_CAPSULE | Freq: Three times a day (TID) | ORAL | Status: DC
  Administered 2019-08-03 – 2019-08-04 (×3): 900 mg via ORAL
  Filled 2019-08-03 (×3): qty 3

## 2019-08-03 MED ORDER — PROPOFOL 10 MG/ML IV BOLUS
INTRAVENOUS | Status: DC | PRN
  Administered 2019-08-03: 160 mg via INTRAVENOUS

## 2019-08-03 MED ORDER — ARTIFICIAL TEARS OPHTHALMIC OINT
TOPICAL_OINTMENT | OPHTHALMIC | Status: DC | PRN
  Administered 2019-08-03: 1 via OPHTHALMIC

## 2019-08-03 MED ORDER — PHENYLEPHRINE 40 MCG/ML (10ML) SYRINGE FOR IV PUSH (FOR BLOOD PRESSURE SUPPORT)
PREFILLED_SYRINGE | INTRAVENOUS | Status: DC | PRN
  Administered 2019-08-03: 80 ug via INTRAVENOUS
  Administered 2019-08-03: 40 ug via INTRAVENOUS

## 2019-08-03 MED ORDER — LIDOCAINE HCL (CARDIAC) PF 100 MG/5ML IV SOSY
PREFILLED_SYRINGE | INTRAVENOUS | Status: DC | PRN
  Administered 2019-08-03: 100 mg via INTRAVENOUS

## 2019-08-03 MED ORDER — KCL IN DEXTROSE-NACL 20-5-0.45 MEQ/L-%-% IV SOLN: INTRAVENOUS | Status: DC

## 2019-08-03 MED ORDER — ALUM & MAG HYDROXIDE-SIMETH 200-200-20 MG/5ML PO SUSP: 30.0000 mL | Freq: Four times a day (QID) | ORAL | Status: DC | PRN

## 2019-08-03 MED ORDER — ALBUTEROL SULFATE 0.63 MG/3ML IN NEBU: 1.0000 | INHALATION_SOLUTION | Freq: Four times a day (QID) | RESPIRATORY_TRACT | Status: DC | PRN

## 2019-08-03 MED ORDER — MIDAZOLAM HCL 2 MG/2ML IJ SOLN
INTRAMUSCULAR | Status: DC | PRN
  Administered 2019-08-03: 2 mg via INTRAVENOUS

## 2019-08-03 MED ORDER — 0.9 % SODIUM CHLORIDE (POUR BTL) OPTIME
TOPICAL | Status: DC | PRN
  Administered 2019-08-03: 1000 mL

## 2019-08-03 MED ORDER — VANCOMYCIN HCL IN DEXTROSE 1-5 GM/200ML-% IV SOLN: 1000.0000 mg | INTRAVENOUS | Status: AC

## 2019-08-03 MED ORDER — TOPIRAMATE 100 MG PO TABS
100.0000 mg | ORAL_TABLET | Freq: Two times a day (BID) | ORAL | Status: DC
  Administered 2019-08-03 – 2019-08-04 (×2): 100 mg via ORAL
  Filled 2019-08-03 (×2): qty 1

## 2019-08-03 MED ORDER — PHENOL 1.4 % MT LIQD: 1.0000 | OROMUCOSAL | Status: DC | PRN

## 2019-08-03 MED ORDER — BISACODYL 10 MG RE SUPP: 10.0000 mg | Freq: Every day | RECTAL | Status: DC | PRN

## 2019-08-03 MED ORDER — MENTHOL 3 MG MT LOZG: 1.0000 | LOZENGE | OROMUCOSAL | Status: DC | PRN

## 2019-08-03 MED ORDER — LIDOCAINE 2% (20 MG/ML) 5 ML SYRINGE
INTRAMUSCULAR | Status: AC
  Filled 2019-08-03: qty 5

## 2019-08-03 MED ORDER — DOCUSATE SODIUM 100 MG PO CAPS
100.0000 mg | ORAL_CAPSULE | Freq: Two times a day (BID) | ORAL | Status: DC
  Administered 2019-08-04: 10:00:00 100 mg via ORAL
  Filled 2019-08-03 (×2): qty 1

## 2019-08-03 MED ORDER — ONDANSETRON HCL 4 MG/2ML IJ SOLN
INTRAMUSCULAR | Status: DC | PRN
  Administered 2019-08-03: 4 mg via INTRAVENOUS

## 2019-08-03 SURGICAL SUPPLY — 91 items
ADH SKN CLS APL DERMABOND .7 (GAUZE/BANDAGES/DRESSINGS) ×1
BAND RUBBER #7 7IN GRN STRL LF (MISCELLANEOUS) ×1 IMPLANT
BASKET BONE COLLECTION (BASKET) ×2 IMPLANT
BLADE CLIPPER SURG (BLADE) IMPLANT
BONE CANC CHIPS 20CC PCAN1/4 (Bone Implant) ×2 IMPLANT
BUR MATCHSTICK NEURO 3.0 LAGG (BURR) ×2 IMPLANT
BUR PRECISION FLUTE 5.0 (BURR) ×2 IMPLANT
CANISTER SUCT 3000ML PPV (MISCELLANEOUS) ×2 IMPLANT
CARTRIDGE OIL MAESTRO DRILL (MISCELLANEOUS) ×1 IMPLANT
CHIPS CANC BONE 20CC PCAN1/4 (Bone Implant) ×1 IMPLANT
CNTNR URN SCR LID CUP LEK RST (MISCELLANEOUS) ×1 IMPLANT
CONT SPEC 4OZ STRL OR WHT (MISCELLANEOUS) ×2
COVER BACK TABLE 24X17X13 BIG (DRAPES) IMPLANT
COVER BACK TABLE 60X90IN (DRAPES) ×2 IMPLANT
COVER ULTRASOUND SURGICAL (MISCELLANEOUS) ×1 IMPLANT
COVER WAND RF STERILE (DRAPES) ×1 IMPLANT
DECANTER SPIKE VIAL GLASS SM (MISCELLANEOUS) ×2 IMPLANT
DERMABOND ADVANCED (GAUZE/BANDAGES/DRESSINGS) ×1
DERMABOND ADVANCED .7 DNX12 (GAUZE/BANDAGES/DRESSINGS) ×1 IMPLANT
DIFFUSER DRILL AIR PNEUMATIC (MISCELLANEOUS) ×2 IMPLANT
DRAPE C-ARM 42X72 X-RAY (DRAPES) ×2 IMPLANT
DRAPE C-ARMOR (DRAPES) ×2 IMPLANT
DRAPE LAPAROTOMY 100X72X124 (DRAPES) ×2 IMPLANT
DRAPE SURG 17X23 STRL (DRAPES) ×2 IMPLANT
DRSG OPSITE POSTOP 4X8 (GAUZE/BANDAGES/DRESSINGS) ×1 IMPLANT
DURAPREP 26ML APPLICATOR (WOUND CARE) ×2 IMPLANT
ELECT BLADE 4.0 EZ CLEAN MEGAD (MISCELLANEOUS) ×2
ELECT REM PT RETURN 9FT ADLT (ELECTROSURGICAL) ×2
ELECTRODE BLDE 4.0 EZ CLN MEGD (MISCELLANEOUS) IMPLANT
ELECTRODE REM PT RTRN 9FT ADLT (ELECTROSURGICAL) ×1 IMPLANT
GAUZE 4X4 16PLY RFD (DISPOSABLE) IMPLANT
GAUZE SPONGE 4X4 12PLY STRL (GAUZE/BANDAGES/DRESSINGS) ×1 IMPLANT
GLOVE BIO SURGEON STRL SZ7 (GLOVE) ×2 IMPLANT
GLOVE BIO SURGEON STRL SZ7.5 (GLOVE) ×1 IMPLANT
GLOVE BIO SURGEON STRL SZ8 (GLOVE) ×4 IMPLANT
GLOVE BIOGEL PI IND STRL 6.5 (GLOVE) IMPLANT
GLOVE BIOGEL PI IND STRL 7.5 (GLOVE) IMPLANT
GLOVE BIOGEL PI IND STRL 8 (GLOVE) ×2 IMPLANT
GLOVE BIOGEL PI IND STRL 8.5 (GLOVE) ×2 IMPLANT
GLOVE BIOGEL PI INDICATOR 6.5 (GLOVE) ×2
GLOVE BIOGEL PI INDICATOR 7.5 (GLOVE) ×1
GLOVE BIOGEL PI INDICATOR 8 (GLOVE) ×2
GLOVE BIOGEL PI INDICATOR 8.5 (GLOVE) ×2
GLOVE ECLIPSE 8.0 STRL XLNG CF (GLOVE) ×4 IMPLANT
GLOVE EXAM NITRILE XL STR (GLOVE) IMPLANT
GLOVE SURG SS PI 6.0 STRL IVOR (GLOVE) ×5 IMPLANT
GOWN STRL REUS W/ TWL LRG LVL3 (GOWN DISPOSABLE) IMPLANT
GOWN STRL REUS W/ TWL XL LVL3 (GOWN DISPOSABLE) ×3 IMPLANT
GOWN STRL REUS W/TWL 2XL LVL3 (GOWN DISPOSABLE) ×2 IMPLANT
GOWN STRL REUS W/TWL LRG LVL3 (GOWN DISPOSABLE) ×4
GOWN STRL REUS W/TWL XL LVL3 (GOWN DISPOSABLE) ×4
GRAFT BNE CANC CHIPS 1-8 20CC (Bone Implant) IMPLANT
IMPL TLX20 8X11X26 20D (Cage) IMPLANT
IMPL TLX20 9X11X26 20D (Cage) IMPLANT
KIT BASIN OR (CUSTOM PROCEDURE TRAY) ×2 IMPLANT
KIT INFUSE SMALL (Orthopedic Implant) ×1 IMPLANT
KIT POSITION SURG JACKSON T1 (MISCELLANEOUS) ×2 IMPLANT
KIT TURNOVER KIT B (KITS) ×2 IMPLANT
MILL MEDIUM DISP (BLADE) ×1 IMPLANT
NDL HYPO 21X1.5 SAFETY (NEEDLE) IMPLANT
NDL HYPO 25X1 1.5 SAFETY (NEEDLE) ×1 IMPLANT
NDL SPNL 18GX3.5 QUINCKE PK (NEEDLE) IMPLANT
NEEDLE HYPO 21X1.5 SAFETY (NEEDLE) ×2 IMPLANT
NEEDLE HYPO 25X1 1.5 SAFETY (NEEDLE) ×2 IMPLANT
NEEDLE SPNL 18GX3.5 QUINCKE PK (NEEDLE) ×2 IMPLANT
NS IRRIG 1000ML POUR BTL (IV SOLUTION) ×2 IMPLANT
OIL CARTRIDGE MAESTRO DRILL (MISCELLANEOUS) ×2
PACK LAMINECTOMY NEURO (CUSTOM PROCEDURE TRAY) ×2 IMPLANT
PAD ARMBOARD 7.5X6 YLW CONV (MISCELLANEOUS) ×6 IMPLANT
PATTIES SURGICAL .5 X.5 (GAUZE/BANDAGES/DRESSINGS) IMPLANT
PATTIES SURGICAL .5 X1 (DISPOSABLE) IMPLANT
PATTIES SURGICAL 1X1 (DISPOSABLE) IMPLANT
ROD RELIN-O LORD 5.5X65MM (Rod) ×2 IMPLANT
SCREW LOCK RELINE 5.5 TULIP (Screw) ×6 IMPLANT
SCREW RELINE-O POLY 6.5X45 (Screw) ×6 IMPLANT
SPONGE LAP 4X18 RFD (DISPOSABLE) IMPLANT
SPONGE SURGIFOAM ABS GEL 100 (HEMOSTASIS) ×2 IMPLANT
STAPLER SKIN PROX WIDE 3.9 (STAPLE) IMPLANT
SUT VIC AB 1 CT1 18XBRD ANBCTR (SUTURE) ×2 IMPLANT
SUT VIC AB 1 CT1 8-18 (SUTURE) ×4
SUT VIC AB 2-0 CT1 18 (SUTURE) ×4 IMPLANT
SUT VIC AB 3-0 SH 8-18 (SUTURE) ×4 IMPLANT
SYR 20ML ECCENTRIC (SYRINGE) ×1 IMPLANT
SYR 5ML LL (SYRINGE) IMPLANT
TLX20 IMPLANT 8X11X26 20D (Cage) ×2 IMPLANT
TLX20 IMPLANT 9X11X26 20D (Cage) ×2 IMPLANT
TOWEL GREEN STERILE (TOWEL DISPOSABLE) ×2 IMPLANT
TOWEL GREEN STERILE FF (TOWEL DISPOSABLE) ×2 IMPLANT
TRAP SPECIMEN MUCOUS 40CC (MISCELLANEOUS) ×1 IMPLANT
TRAY FOLEY MTR SLVR 16FR STAT (SET/KITS/TRAYS/PACK) ×2 IMPLANT
WATER STERILE IRR 1000ML POUR (IV SOLUTION) ×2 IMPLANT

## 2019-08-03 NOTE — Anesthesia Procedure Notes (Signed)
Procedure Name: Intubation Date/Time: 08/03/2019 9:03 AM Performed by: Raenette Rover, CRNA Pre-anesthesia Checklist: Patient identified, Emergency Drugs available, Suction available and Patient being monitored Patient Re-evaluated:Patient Re-evaluated prior to induction Oxygen Delivery Method: Circle system utilized Preoxygenation: Pre-oxygenation with 100% oxygen Induction Type: IV induction Ventilation: Mask ventilation without difficulty Laryngoscope Size: Mac and 3 Grade View: Grade I Tube type: Oral Tube size: 7.0 mm Number of attempts: 1 Airway Equipment and Method: Stylet Placement Confirmation: ETT inserted through vocal cords under direct vision,  positive ETCO2 and breath sounds checked- equal and bilateral Secured at: 21 cm Tube secured with: Tape Dental Injury: Teeth and Oropharynx as per pre-operative assessment

## 2019-08-03 NOTE — Interval H&P Note (Signed)
History and Physical Interval Note:  08/03/2019 8:55 AM  Susan Ward  has presented today for surgery, with the diagnosis of Degenerative lumbar spinal stenosis.  The various methods of treatment have been discussed with the patient and family. After consideration of risks, benefits and other options for treatment, the patient has consented to  Procedure(s): Right Lumbar 3-4, Left Lumbar 4-5 Transforaminal lumbar interbody fusion (Bilateral) as a surgical intervention.  The patient's history has been reviewed, patient examined, no change in status, stable for surgery.  I have reviewed the patient's chart and labs.  Questions were answered to the patient's satisfaction.     Peggyann Shoals

## 2019-08-03 NOTE — Anesthesia Postprocedure Evaluation (Signed)
Anesthesia Post Note  Patient: Susan Ward  Procedure(s) Performed: Right Lumbar Three-Four Left Lumbar Four-Five Transforaminal lumbar interbody fusion (Bilateral Spine Lumbar)     Patient location during evaluation: PACU Anesthesia Type: General Level of consciousness: awake Pain management: pain level controlled Vital Signs Assessment: post-procedure vital signs reviewed and stable Respiratory status: spontaneous breathing Cardiovascular status: stable Postop Assessment: no apparent nausea or vomiting Anesthetic complications: no    Last Vitals:  Vitals:   08/03/19 1430 08/03/19 1445  BP: 123/68 (!) 108/59  Pulse: 95 73  Resp: (!) 28 17  Temp:    SpO2: 97% 96%    Last Pain:  Vitals:   08/03/19 1430  TempSrc:   PainSc: 0-No pain                 Nioka Thorington

## 2019-08-03 NOTE — Progress Notes (Signed)
Pharmacy Antibiotic Note  Susan Ward is a 54 y.o. female admitted on 08/03/2019 s/p Right Lumbar Three-Four Left Lumbar Four-Five Transforaminal lumbar interbody fusion (Bilateral) - posterior with pedicle screw fixation, posterolateral arthrodesis  (redo decompression and discectomy at L 45 left) .  Pharmacy has been consulted for vancomycin dosing. No drains noted.   Plan: Vancomycin 1gm IV x 1 (12 hours after pre-op dose) Pharmacy will sign off  Height: 5\' 4"  (162.6 cm) Weight: 161 lb (73 kg) IBW/kg (Calculated) : 54.7  Temp (24hrs), Avg:98.4 F (36.9 C), Min:98.2 F (36.8 C), Max:98.6 F (37 C)  Recent Labs  Lab 07/30/19 0953  WBC 9.8    CrCl cannot be calculated (Patient's most recent lab result is older than the maximum 21 days allowed.).    Allergies  Allergen Reactions  . Oxycodone Shortness Of Breath and Other (See Comments)    breathing issues   . Penicillin G Rash and Other (See Comments)    Yeast Infection Did it involve swelling of the face/tongue/throat, SOB, or low BP? No Did it involve sudden or severe rash/hives, skin peeling, or any reaction on the inside of your mouth or nose? Yes Did you need to seek medical attention at a hospital or doctor's office? Yes When did it last happen?More than 20 years ago   . Aspirin Nausea And Vomiting    upset stomach    Susan Ward A. Levada Dy, PharmD, BCPS, FNKF Clinical Pharmacist [name] Please utilize Amion for appropriate phone number to reach the unit pharmacist ([place])   08/03/2019 3:37 PM

## 2019-08-03 NOTE — Op Note (Signed)
08/03/2019  12:53 PM  PATIENT:  Susan Ward  54 y.o. female  PRE-OPERATIVE DIAGNOSIS:  Degenerative lumbar spinal stenosis, lumbar foraminal stenosis, recurrent disc herniation, lumbago, radiculopathy L 34, L 45 levels  POST-OPERATIVE DIAGNOSIS:  Degenerative lumbar spinal stenosis, lumbar foraminal stenosis, recurrent disc herniation, lumbago, radiculopathy L 34, L 45 levels  PROCEDURE:  Procedure(s) with comments: Right Lumbar Three-Four Left Lumbar Four-Five Transforaminal lumbar interbody fusion (Bilateral) - posterior with pedicle screw fixation, posterolateral arthrodesis  (redo decompression and discectomy at L 45 left)  SURGEON:  Surgeon(s) and Role:    Erline Levine, MD - Primary    * Vallarie Mare, MD - Assisting  PHYSICIAN ASSISTANT:   ASSISTANTS: Poteat, RN   ANESTHESIA:   general  EBL:  100 mL   BLOOD ADMINISTERED:none  DRAINS: none   LOCAL MEDICATIONS USED:  MARCAINE    and LIDOCAINE   SPECIMEN:  No Specimen  DISPOSITION OF SPECIMEN:  N/A  COUNTS:  YES  TOURNIQUET:  * No tourniquets in log *  DICTATION: Patient is 54 year old woman with  HNP, spondylosis,  stenosis, DDD, radiculopathy L 45, L 34 levels. He has a severe bilateral leg pain and weakness. It was elected to take her to surgery for TLIF decompression and fusion at L 34 and L 45 levels.  She has previously undergone decompression at L 45 on the left.  Procedure: Patient was placed in a prone position on the [place] table after smooth and uncomplicated induction of general endotracheal anesthesia. Preoperative localizing radiographic images were obtained with C arm and LessRay.  Her low back was prepped and draped in usual sterile fashion after prepping with Betadine and with DuraPrep. Area of incision was infiltrated with local lidocaine. Incision was made to the lumbodorsal fascia was incised and exposure was performed of the L3 and L 4  spinous processes laminae facet joint and transverse  processes. Intraoperative x-ray was obtained which confirmed correct orientation. A total hemi-laminectomy of L 45 was performed with disarticulation of the facet joints at this level and thorough decompression was performed of both L4, L5  nerve roots along with the common dural tube on the left and subsequently, a similar decompression was performed at L 34 on the right. There was densely adherent spondylytic material compressing the thecal sac and both L4 and L5 nerve roots.  A thorough discectomy and preparation of the endplates was performed at both levels.  At L 45, small BMP with 10 cc autograft was placed in the disc space, followed by an 8 x 11 x 26 mm expandable titanium TLIF cage.  After a thorough decompression with right discectomy at L 34 (a large calcified disc rupture with caudally migrated fragment was carefully removed), a similar expandable  titanium TLIF cage was inserted from the right, this one 9 x 11 x 26 mm.  The posterolateral region was extensively decorticated and pedicle probes were placed at L 3, L 4 and L 5 bilaterally. Intraoperative fluoroscopy confirmed correct orientationin the AP and lateral plane. 45 x 6.5 mm screws were placed at each level.   Final x-rays demonstrated well-positioned interbody grafts and pedicle screw fixation. 65 mm lordotic rods were placed and locked down in situ and the posterolateral region was packed with the remaining BMP and bone autograft with graft extender on both sides (20 cc on each side). Incision was infiltrated 20 cc of long-acting Marcaine was used in the posterolateral soft tissue. Fascia was closed with 1 Vicryl sutures skin  edges were reapproximated 2 and 3-0 Vicryl sutures. The wound was dressed with Dermabond and an occlusive dressing.  The patient was extubated in the operating room and taken to recovery in stable satisfactory condition. Counts were correct at the end of the case.   PLAN OF CARE: Admit to inpatient   PATIENT  DISPOSITION:  PACU - hemodynamically stable.   Delay start of Pharmacological VTE agent (>24hrs) due to surgical blood loss or risk of bleeding: yes

## 2019-08-03 NOTE — Brief Op Note (Signed)
08/03/2019  12:53 PM  PATIENT:  Susan Ward  54 y.o. female  PRE-OPERATIVE DIAGNOSIS:  Degenerative lumbar spinal stenosis, lumbar foraminal stenosis, recurrent disc herniation, lumbago, radiculopathy L 34, L 45 levels  POST-OPERATIVE DIAGNOSIS:  Degenerative lumbar spinal stenosis, lumbar foraminal stenosis, recurrent disc herniation, lumbago, radiculopathy L 34, L 45 levels  PROCEDURE:  Procedure(s) with comments: Right Lumbar Three-Four Left Lumbar Four-Five Transforaminal lumbar interbody fusion (Bilateral) - posterior with pedicle screw fixation, posterolateral arthrodesis  (redo decompression and discectomy at L 45 left)  SURGEON:  Surgeon(s) and Role:    Erline Levine, MD - Primary    * Vallarie Mare, MD - Assisting  PHYSICIAN ASSISTANT:   ASSISTANTS: Poteat, RN   ANESTHESIA:   general  EBL:  100 mL   BLOOD ADMINISTERED:none  DRAINS: none   LOCAL MEDICATIONS USED:  MARCAINE    and LIDOCAINE   SPECIMEN:  No Specimen  DISPOSITION OF SPECIMEN:  N/A  COUNTS:  YES  TOURNIQUET:  * No tourniquets in log *  DICTATION: Patient is 54 year old woman with  HNP, spondylosis,  stenosis, DDD, radiculopathy L 45, L 34 levels. He has a severe bilateral leg pain and weakness. It was elected to take her to surgery for TLIF decompression and fusion at L 34 and L 45 levels.  She has previously undergone decompression at L 45 on the left.  Procedure: Patient was placed in a prone position on the [place] table after smooth and uncomplicated induction of general endotracheal anesthesia. Preoperative localizing radiographic images were obtained with C arm and LessRay.  Her low back was prepped and draped in usual sterile fashion after prepping with Betadine and with DuraPrep. Area of incision was infiltrated with local lidocaine. Incision was made to the lumbodorsal fascia was incised and exposure was performed of the L3 and L 4  spinous processes laminae facet joint and transverse  processes. Intraoperative x-ray was obtained which confirmed correct orientation. A total hemi-laminectomy of L 45 was performed with disarticulation of the facet joints at this level and thorough decompression was performed of both L4, L5  nerve roots along with the common dural tube on the left and subsequently, a similar decompression was performed at L 34 on the right. There was densely adherent spondylytic material compressing the thecal sac and both L4 and L5 nerve roots.  A thorough discectomy and preparation of the endplates was performed at both levels.  At L 45, small BMP with 10 cc autograft was placed in the disc space, followed by an 8 x 11 x 26 mm expandable titanium TLIF cage.  After a thorough decompression with right discectomy at L 34 (a large calcified disc rupture with caudally migrated fragment was carefully removed), a similar expandable  titanium TLIF cage was inserted from the right, this one 9 x 11 x 26 mm.  The posterolateral region was extensively decorticated and pedicle probes were placed at L 3, L 4 and L 5 bilaterally. Intraoperative fluoroscopy confirmed correct orientationin the AP and lateral plane. 45 x 6.5 mm screws were placed at each level.   Final x-rays demonstrated well-positioned interbody grafts and pedicle screw fixation. 65 mm lordotic rods were placed and locked down in situ and the posterolateral region was packed with the remaining BMP and bone autograft with graft extender on both sides (20 cc on each side). Incision was infiltrated 20 cc of long-acting Marcaine was used in the posterolateral soft tissue. Fascia was closed with 1 Vicryl sutures skin  edges were reapproximated 2 and 3-0 Vicryl sutures. The wound was dressed with Dermabond and an occlusive dressing.  The patient was extubated in the operating room and taken to recovery in stable satisfactory condition. Counts were correct at the end of the case.   PLAN OF CARE: Admit to inpatient   PATIENT  DISPOSITION:  PACU - hemodynamically stable.   Delay start of Pharmacological VTE agent (>24hrs) due to surgical blood loss or risk of bleeding: yes

## 2019-08-03 NOTE — Transfer of Care (Signed)
Immediate Anesthesia Transfer of Care Note  Patient: Susan Ward  Procedure(s) Performed: Right Lumbar Three-Four Left Lumbar Four-Five Transforaminal lumbar interbody fusion (Bilateral Spine Lumbar)  Patient Location: PACU  Anesthesia Type:General  Level of Consciousness: drowsy and patient cooperative  Airway & Oxygen Therapy: Patient Spontanous Breathing and Patient connected to face mask oxygen  Post-op Assessment: Report given to RN and Post -op Vital signs reviewed and stable  Post vital signs: Reviewed and stable  Last Vitals:  Vitals Value Taken Time  BP 139/55 08/03/19 1301  Temp    Pulse 99 08/03/19 1305  Resp 15 08/03/19 1305  SpO2 98 % 08/03/19 1305  Vitals shown include unvalidated device data.  Last Pain:  Vitals:   08/03/19 0818  TempSrc:   PainSc: 0-No pain      Patients Stated Pain Goal: 3 ([id number])  Complications: No apparent anesthesia complications

## 2019-08-04 MED ORDER — METHOCARBAMOL 500 MG PO TABS: 500.0000 mg | ORAL_TABLET | Freq: Four times a day (QID) | ORAL | 1 refills | Status: DC | PRN

## 2019-08-04 MED ORDER — HYDROCODONE-ACETAMINOPHEN 10-325 MG PO TABS: 1.0000 | ORAL_TABLET | ORAL | 0 refills | Status: AC | PRN

## 2019-08-04 NOTE — Evaluation (Signed)
Physical Therapy Evaluation Patient Details Name: Susan Ward MRN: [id number] DOB: [birth year] Today's Date: 08/04/2019   History of Present Illness  54 y/o female s/p L3-5 TLIF. No significant PMH on file.  Clinical Impression  Patient is s/p above surgery resulting in the deficits listed below (see PT Problem List). Pt requires RW for safe ambulation and pain management during ambulation. Patient will benefit from skilled PT to increase their independence and safety with mobility (while adhering to their precautions) to allow discharge to the venue listed below.     Follow Up Recommendations No PT follow up;Supervision/Assistance - 24 hour    Equipment Recommendations  Rolling walker with 5" wheels(youth)    Recommendations for Other Services       Precautions / Restrictions Precautions Precautions: Fall;Back Precaution Booklet Issued: Yes (comment) Precaution Comments: pt with no recall of precautions, pt re-educated Required Braces or Orthoses: Spinal Brace Spinal Brace: Lumbar corset;Applied in sitting position Restrictions Weight Bearing Restrictions: No Other Position/Activity Restrictions: okay to remove brace to shower      Mobility  Bed Mobility Overal bed mobility: Needs Assistance Bed Mobility: Sidelying to Sit;Sit to Sidelying   Sidelying to sit: Supervision     Sit to sidelying: Supervision General bed mobility comments: no use of bed rail, increased time, no physical assist  Transfers Overall transfer level: Needs assistance Equipment used: Rolling walker (2 wheeled) Transfers: Sit to/from Stand Sit to Stand: Min assist         General transfer comment: minA to power up into standing, verbal cues for hand placement, increased time, minA to steady during transition of hands to walker from bed  Ambulation/Gait Ambulation/Gait assistance: Min guard Gait Distance (Feet): 150 Feet Assistive device: Rolling walker (2 wheeled) Gait Pattern/deviations:  Step-through pattern;Decreased stride length Gait velocity: slow Gait velocity interpretation: <1.31 ft/sec, indicative of household ambulator General Gait Details: pt initially ambulating without RW however very antalgic and reaching for hallway rail and side of bed, pt given RW and demo'd improved fluidity of gait pattern, increased step length, no antalgia, pt reports "It does feel better walking with this"  Stairs Stairs: Yes Stairs assistance: Min assist Stair Management: One rail Right;Step to pattern;Sideways;Forwards Number of Stairs: 2 General stair comments: pt unable to ascend with L LE, able to ascend with R safely  Wheelchair Mobility    Modified Rankin (Stroke Patients Only)       Balance Overall balance assessment: Mild deficits observed, not formally tested                                           Pertinent Vitals/Pain Pain Assessment: 0-10 Pain Score: 7  Faces Pain Scale: Hurts even more Pain Location: lumbar incisional site Pain Descriptors / Indicators: Aching;Sore Pain Intervention(s): Limited activity within patient's tolerance;Monitored during session;Repositioned    Home Living Family/patient expects to be discharged to:: Private residence Living Arrangements: Spouse/significant other;Children;Other relatives(daughter, 24 year old granddaughter) Available Help at Discharge: Family;Available 24 hours/day(husband works but is taking a week off) Type of Home: House Home Access: Stairs to enter Entrance Stairs-Rails: Right Entrance Stairs-Number of Steps: 2 Home Layout: One level Home Equipment: [place] - single point Additional Comments: built in shower seat    Prior Function Level of Independence: Independent               Hand Dominance   Dominant Hand: Right  Extremity/Trunk Assessment   Upper Extremity Assessment Upper Extremity Assessment: Overall WFL for tasks assessed(how reports of tinging in R finger tips)     Lower Extremity Assessment Lower Extremity Assessment: LLE deficits/detail LLE Deficits / Details: grossly 3/5 unable to ascend stairs leading with the L LE    Cervical / Trunk Assessment Cervical / Trunk Assessment: Other exceptions Cervical / Trunk Exceptions: recent lumbar spinal sx  Communication   Communication: No difficulties  Cognition Arousal/Alertness: Awake/alert Behavior During Therapy: WFL for tasks assessed/performed Overall Cognitive Status: Impaired/Different from baseline Area of Impairment: Safety/judgement;Memory                     Memory: Decreased short-term memory;Decreased recall of precautions         General Comments: pt with no recall of precautions      General Comments General comments (skin integrity, edema, etc.): incision covered by dressing    Exercises     Assessment/Plan    PT Assessment Patient needs continued PT services  PT Problem List Decreased strength;Decreased range of motion;Decreased activity tolerance;Decreased balance;Decreased mobility;Decreased knowledge of use of DME;Decreased safety awareness       PT Treatment Interventions DME instruction;Gait training;Stair training;Functional mobility training;Therapeutic activities;Therapeutic exercise;Balance training;Neuromuscular re-education    PT Goals (Current goals can be found in the Care Plan section)  Acute Rehab PT Goals Patient Stated Goal: return to independence PT Goal Formulation: With patient Time For Goal Achievement: 08/18/19 Potential to Achieve Goals: Good    Frequency Min 5X/week   Barriers to discharge        Co-evaluation               AM-PAC PT "6 Clicks" Mobility  Outcome Measure Help needed turning from your back to your side while in a flat bed without using bedrails?: None Help needed moving from lying on your back to sitting on the side of a flat bed without using bedrails?: None Help needed moving to and from a bed to a  chair (including a wheelchair)?: None Help needed standing up from a chair using your arms (e.g., wheelchair or bedside chair)?: A Little Help needed to walk in hospital room?: A Little Help needed climbing 3-5 steps with a railing? : A Little 6 Click Score: 21    End of Session Equipment Utilized During Treatment: Gait belt;Back brace Activity Tolerance: Patient tolerated treatment well Patient left: in bed;with call bell/phone within reach(sitting EOB) Nurse Communication: Mobility status PT Visit Diagnosis: Unsteadiness on feet (R26.81);Difficulty in walking, not elsewhere classified (R26.2)    Time: [id number] PT Time Calculation (min) (ACUTE ONLY): 17 min   Charges:   PT Evaluation $PT Eval Moderate Complexity: 1 Mod          Kittie Plater, PT, DPT Acute Rehabilitation Services Pager #: [phone] Office #: [phone]   Berline Lopes 08/04/2019, 11:05 AM

## 2019-08-04 NOTE — Evaluation (Signed)
Occupational Therapy Evaluation Patient Details Name: Susan Ward MRN: [id number] DOB: 01-05-1966 Today's Date: 08/04/2019    History of Present Illness 54 y/o female s/p L3-5 TLIF. No significant PMH on file.   Clinical Impression   PTA Pt independent in community. At time of eval, able to complete sit <> stand transfers with supervision level of assist. Back handout provided and reviewed adls in detail. Pt educated on: clothing between brace, never sleep in brace, avoid sitting for long periods of time, correct bed positioning for sleeping, correct sequence for bed mobility, avoiding lifting more than 5 pounds and never wash directly over incision. Pt requires intermittent min A for LB dressing 2/2 leg pain. With increased time and rest, pt can complete figure 4 position. Educated on use of sock aide/reacher as needed for home. Also educated pt on use of 3:1 over toilet for comfort height. All education is complete and patient indicates understanding. No OT follow up necessary, OT will sign off in anticipation for d/c this date. Thank you for this consult.    Follow Up Recommendations  No OT follow up;Supervision - Intermittent    Equipment Recommendations  3 in 1 bedside commode    Recommendations for Other Services       Precautions / Restrictions Precautions Precautions: Fall;Back Precaution Booklet Issued: Yes (comment) Precaution Comments: reviewed in context of BADL Required Braces or Orthoses: Spinal Brace Spinal Brace: Lumbar corset;Applied in sitting position Restrictions Weight Bearing Restrictions: No Other Position/Activity Restrictions: okay to remove brace to shower      Mobility Bed Mobility               General bed mobility comments: up in hallway with RN staff  Transfers Overall transfer level: Needs assistance   Transfers: Sit to/from Stand Sit to Stand: Supervision         General transfer comment: cues for safety and taking time to control  descent    Balance Overall balance assessment: Mild deficits observed, not formally tested                                         ADL either performed or assessed with clinical judgement   ADL Overall ADL's : Needs assistance/impaired Eating/Feeding: Independent   Grooming: Independent   Upper Body Bathing: Independent   Lower Body Bathing: Minimal assistance   Upper Body Dressing : Independent   Lower Body Dressing: Minimal assistance Lower Body Dressing Details (indicate cue type and reason): intermittent min A for LB Dressing. With increased time and rest pt is able to demonstrate figure 4 position Toilet Transfer: Copy Details (indicate cue type and reason): requires comfort height toilet, education given on BSC over toilet for home Toileting- Clothing Manipulation and Hygiene: Supervision/safety;Adhering to back precautions   Tub/ Shower Transfer: Supervision/safety;Ambulation;Shower Scientist, research (medical) Details (indicate cue type and reason): has built in shower seat at home Functional mobility during ADLs: Supervision/safety       Vision Patient Visual Report: No change from baseline       Perception     Praxis      Pertinent Vitals/Pain Pain Assessment: Faces Faces Pain Scale: Hurts even more Pain Location: lumbar incisional site Pain Descriptors / Indicators: Aching;Sore Pain Intervention(s): Limited activity within patient's tolerance;Monitored during session;Repositioned     Hand Dominance     Extremity/Trunk Assessment Upper Extremity Assessment Upper Extremity Assessment:  Overall [facility] for tasks assessed   Lower Extremity Assessment Lower Extremity Assessment: Defer to PT evaluation   Cervical / Trunk Assessment Cervical / Trunk Assessment: Other exceptions Cervical / Trunk Exceptions: recent lumbar spinal sx   Communication Communication Communication: No difficulties   Cognition  Arousal/Alertness: Awake/alert Behavior During Therapy: WFL for tasks assessed/performed Overall Cognitive Status: Within Functional Limits for tasks assessed                                     General Comments       Exercises     Shoulder Instructions      Home Living Family/patient expects to be discharged to:: Private residence Living Arrangements: Spouse/significant other;Children;Other relatives(daughter, 22 year old granddaughter) Available Help at Discharge: Family;Available 24 hours/day(husband home 24/7) Type of Home: House Home Access: Stairs to enter [employer] of Steps: 2 Entrance Stairs-Rails: Right Home Layout: One level     Bathroom Shower/Tub: Occupational psychologist: Standard     Home Equipment: None   Additional Comments: build in shower seat      Prior Functioning/Environment Level of Independence: Independent                 OT Problem List: Decreased knowledge of use of DME or AE;Decreased knowledge of precautions;Impaired balance (sitting and/or standing);Pain      OT Treatment/Interventions:      OT Goals(Current goals can be found in the care plan section) Acute Rehab OT Goals Patient Stated Goal: return to independence OT Goal Formulation: With patient Time For Goal Achievement: 08/18/19 Potential to Achieve Goals: Good  OT Frequency:     Barriers to D/C:            Co-evaluation              AM-PAC OT "6 Clicks" Daily Activity     Outcome Measure Help from another person eating meals?: None Help from another person taking care of personal grooming?: None Help from another person toileting, which includes using toliet, bedpan, or urinal?: None Help from another person bathing (including washing, rinsing, drying)?: A Little Help from another person to put on and taking off regular upper body clothing?: None Help from another person to put on and taking off regular lower body  clothing?: A Little 6 Click Score: 22   End of Session Equipment Utilized During Treatment: Gait belt;Back brace Nurse Communication: Mobility status;Precautions  Activity Tolerance: Patient tolerated treatment well Patient left: in bed;Other (comment)(sitting EOB)  OT Visit Diagnosis: Other abnormalities of gait and mobility (R26.89);Pain Pain - part of body: (lumbar spine)                Time: [id number] OT Time Calculation (min): 24 min Charges:  OT General Charges $OT Visit: 1 Visit OT Treatments $Self Care/Home Management : 8-22 mins  Zenovia Jarred, MSOT, OTR/L [facility] Office Number: [phone] Pager: [phone]  Zenovia Jarred 08/04/2019, 8:59 AM

## 2019-08-04 NOTE — Discharge Instructions (Signed)

## 2019-08-04 NOTE — Discharge Summary (Signed)
Physician Discharge Summary  Patient ID: NHUNG NEIDERHISER MRN: [id number] DOB/AGE: 1966/05/20 54 y.o.  Admit date: 08/03/2019 Discharge date: 08/04/2019  Admission Diagnoses: Degenerative lumbar spinal stenosis, lumbar foraminal stenosis, recurrent disc herniation, lumbago, radiculopathy L 34, L 45 levels     Discharge Diagnoses: Degenerative lumbar spinal stenosis, lumbar foraminal stenosis, recurrent disc herniation, lumbago, radiculopathy L 34, L 45 levels s/p Right Lumbar Three-Four Left Lumbar Four-Five Transforaminal lumbar interbody fusion (Bilateral) - posterior with pedicle screw fixation, posterolateral arthrodesis  (redo decompression and discectomy at L 45 left)       Active Problems:   Lumbar foraminal stenosis   Discharged Condition: good  Hospital Course: Ameliamae Heckel was admitted for surgery with dx stenosis and radiculopathy. Following uncomplicated surgery(as above), she recovered nicely and transferred to [facility] for nursing care and therapies. She is mobilizing well.  Consults: None  Significant Diagnostic Studies: radiology: X-Ray: intra-op  Treatments: surgery: Right Lumbar Three-Four Left Lumbar Four-Five Transforaminal lumbar interbody fusion (Bilateral) - posterior with pedicle screw fixation, posterolateral arthrodesis  (redo decompression and discectomy at L 45 left)     Discharge Exam: Blood pressure (!) 121/58, pulse 92, temperature 98.6 F (37 C), temperature source Oral, resp. rate 16, height 5\' 4"  (1.626 m), weight 73 kg, SpO2 96 %. Alert, conversant, reporting lumbar and BLE soreness. Incision without erythema, swelling or drainage beneath honeycomb and Dermabond. Good strength BLE. Ambulated yesterday and this am. Pt reports "my arms jerk when I reach for things", but symptoms are not present with this visit.     Disposition: Discharge disposition: 01-Home or Self Care Pain medication and muscle relaxer will be eRx'ed to pt's pharmacy for prn home use.  Office f/u in 3 weeks with x-rays       Discharge Instructions    Diet - low sodium heart healthy   Complete by: As directed    Increase activity slowly   Complete by: As directed      Allergies as of 08/04/2019      Reactions   Oxycodone Shortness Of Breath, Other (See Comments)   breathing issues    Penicillin G Rash, Other (See Comments)   Yeast Infection Did it involve swelling of the face/tongue/throat, SOB, or low BP? No Did it involve sudden or severe rash/hives, skin peeling, or any reaction on the inside of your mouth or nose? Yes Did you need to seek medical attention at a hospital or doctor's office? Yes When did it last happen?More than 20 years ago   Aspirin Nausea And Vomiting   upset stomach      Medication List    TAKE these medications   albuterol 108 (90 Base) MCG/ACT inhaler Commonly known as: VENTOLIN HFA Inhale 2 puffs into the lungs every 6 (six) hours as needed for wheezing or shortness of breath.   albuterol 0.63 MG/3ML nebulizer solution Commonly known as: ACCUNEB Take 3 mLs (0.63 mg total) by nebulization every 6 (six) hours as needed for wheezing.   atorvastatin 40 MG tablet Commonly known as: LIPITOR Take 1 tablet (40 mg total) by mouth daily at 6 PM.   benzonatate 100 MG capsule Commonly known as: Tessalon Perles Take 1 capsule (100 mg total) by mouth 3 (three) times daily as needed for cough.   cetirizine 10 MG tablet Commonly known as: ZYRTEC TAKE ONE (1) TABLET EACH DAY What changed: See the new instructions.   DULoxetine 60 MG capsule Commonly known as: CYMBALTA Take 1 capsule (60 mg total) by mouth  2 (two) times daily.   famotidine 40 MG tablet Commonly known as: PEPCID TAKE ONE (1) TABLET EACH DAY What changed: See the new instructions.   fluticasone 50 MCG/ACT nasal spray Commonly known as: FLONASE Place 1 spray into both nostrils 2 (two) times daily. What changed:   when to take this  reasons to take this    gabapentin 300 MG capsule Commonly known as: NEURONTIN Take 3 capsules (900 mg total) by mouth 3 (three) times daily.   HYDROcodone-acetaminophen 10-325 MG tablet Commonly known as: NORCO Take 1 tablet by mouth every 4 (four) hours as needed. What changed: reasons to take this   HYDROcodone-acetaminophen 10-325 MG tablet Commonly known as: NORCO Take 1 tablet by mouth every 4 (four) hours as needed. What changed:   Another medication with the same name was added. Make sure you understand how and when to take each.  Another medication with the same name was changed. Make sure you understand how and when to take each.   HYDROcodone-acetaminophen 10-325 MG tablet Commonly known as: NORCO Take 1 tablet by mouth every 4 (four) hours as needed. What changed:   Another medication with the same name was added. Make sure you understand how and when to take each.  Another medication with the same name was changed. Make sure you understand how and when to take each.   HYDROcodone-acetaminophen 10-325 MG tablet Commonly known as: NORCO Take 1 tablet by mouth every 4 (four) hours as needed for moderate pain. What changed: You were already taking a medication with the same name, and this prescription was added. Make sure you understand how and when to take each.   linaclotide 145 MCG Caps capsule Commonly known as: Linzess Take 1 capsule (145 mcg total) by mouth daily before breakfast. What changed:   when to take this  reasons to take this   methocarbamol 500 MG tablet Commonly known as: ROBAXIN Take 1 tablet (500 mg total) by mouth every 6 (six) hours as needed for muscle spasms.   omeprazole 40 MG capsule Commonly known as: PRILOSEC TAKE ONE (1) CAPSULE EACH DAY What changed: See the new instructions.   topiramate 200 MG tablet Commonly known as: TOPAMAX Take 1 tablet (200 mg total) by mouth 2 (two) times daily. What changed: how much to take      Follow-up Information     Erline Levine, MD Follow up.   Specialty: Neurosurgery Contact information: [address] [place] [address] [phone]           Signed: Verdis Prime 08/04/2019, 8:18 AM  .

## 2019-08-04 NOTE — Progress Notes (Addendum)
Subjective: Patient reports "I'm sore. And my arms jerk when I reach for things"  Objective: Vital signs in last 24 hours: Temp:  [98.2 F (36.8 C)-100.5 F (38.1 C)] 99.5 F (37.5 C) (03/17 0600) Pulse Rate:  [70-100] 82 (03/17 0408) Resp:  [15-28] 18 (03/17 0408) BP: (103-168)/(55-72) 120/70 (03/17 0408) SpO2:  [95 %-100 %] 96 % (03/17 0408) Weight:  [73 kg] 73 kg (03/16 0737)  Intake/Output from previous day: 03/16 0701 - 03/17 0700 In: 1040 [P.O.:240; I.V.:800] Out: 1150 [Urine:1050; Blood:100] Intake/Output this shift: No intake/output data recorded.  Alert, conversant, reporting lumbar and BLE soreness. Incision without erythema, swelling or drainage beneath honeycomb and Dermabond. Good strength BLE. Ambulated yesterday and this am. Pt reports "my arms jerk when I reach for things", but symptoms are not present with this visit.  Lab Results: No results for input(s): WBC, HGB, HCT, PLT in the last 72 hours. BMET No results for input(s): NA, K, CL, CO2, GLUCOSE, BUN, CREATININE, CALCIUM in the last 72 hours.  Studies/Results: DG Lumbar Spine 2-3 Views  Result Date: 08/03/2019 CLINICAL DATA:  Surgical lumbar fusion. EXAM: LUMBAR SPINE - 2-3 VIEW; DG C-ARM 1-60 MIN FLUOROSCOPY TIME:  57 seconds. COMPARISON:  None. FINDINGS: Two intraoperative fluoroscopic images demonstrate intrapedicular screw placement bilaterally at L3, L4 and L5. Interbody fusion of L3-4 and L4-5 is noted as well. IMPRESSION: Bilateral intrapedicular screw placement as described above. Electronically Signed   By: Marijo Conception M.D.   On: 08/03/2019 14:01   DG C-Arm 1-60 Min  Result Date: 08/03/2019 CLINICAL DATA:  Surgical lumbar fusion. EXAM: LUMBAR SPINE - 2-3 VIEW; DG C-ARM 1-60 MIN FLUOROSCOPY TIME:  57 seconds. COMPARISON:  None. FINDINGS: Two intraoperative fluoroscopic images demonstrate intrapedicular screw placement bilaterally at L3, L4 and L5. Interbody fusion of L3-4 and L4-5 is noted as well.  IMPRESSION: Bilateral intrapedicular screw placement as described above. Electronically Signed   By: Marijo Conception M.D.   On: 08/03/2019 14:01    Assessment/Plan: improving  LOS: 1 day  Mobilize with PT in brace.    Verdis Prime 08/04/2019, 7:34 AM   Patient is doing well.  Discharge home.

## 2019-08-04 NOTE — Plan of Care (Signed)
Patient alert and oriented, mae's well, voiding adequate amount of urine, swallowing without difficulty, no c/o pain at time of discharge. Patient discharged home with family. Script and discharged instructions given to patient. Patient and family stated understanding of instructions given. Patient has an appointment with Dr.Stern    

## 2019-08-05 MED FILL — Sodium Chloride IV Soln 0.9%: INTRAVENOUS | Qty: 1000 | Status: AC

## 2019-08-05 MED FILL — Heparin Sodium (Porcine) Inj 1000 Unit/ML: INTRAMUSCULAR | Qty: 30 | Status: AC

## 2019-08-06 ENCOUNTER — Encounter: Payer: Self-pay | Admitting: *Deleted

## 2019-08-09 ENCOUNTER — Telehealth: Payer: Self-pay | Admitting: Physician Assistant

## 2019-08-09 NOTE — Telephone Encounter (Signed)
Suggested to pt to see if surgeon maybe able to order for DME commode seat since he has recently seen her, for her to contact his office.

## 2019-08-11 ENCOUNTER — Telehealth: Payer: Self-pay | Admitting: Physician Assistant

## 2019-08-11 NOTE — Chronic Care Management (AMB) (Signed)
  Chronic Care Management   Note  08/11/2019 Name: Susan Ward MRN: [id number] DOB: Apr 07, 1966  LASANDRA BATLEY is a 54 y.o. year old female who is a primary care patient of Terald Sleeper, PA-C. I reached out to [name] by phone today in response to a referral sent by Ms. Sharmon Leyden Keiper's health plan.     Ms. Keeven was given information about Chronic Care Management services today including:  1. CCM service includes personalized support from designated clinical staff supervised by her physician, including individualized plan of care and coordination with other care providers 2. 24/7 contact phone numbers for assistance for urgent and routine care needs. 3. Service will only be billed when office clinical staff spend 20 minutes or more in a month to coordinate care. 4. Only one practitioner may furnish and bill the service in a calendar month. 5. The patient may stop CCM services at any time (effective at the end of the month) by phone call to the office staff. 6. The patient will be responsible for cost sharing (co-pay) of up to 20% of the service fee (after annual deductible is met).  Patient agreed to services and verbal consent obtained.   Follow up plan: Telephone appointment with care management team member scheduled for:02/04/2020.  [place], [place] [address] Direct Dial: [phone] [email or web address] Website: [email or web address]

## 2019-08-30 DIAGNOSIS — M48061 Spinal stenosis, lumbar region without neurogenic claudication: Secondary | ICD-10-CM | POA: Diagnosis not present

## 2019-09-02 ENCOUNTER — Other Ambulatory Visit: Payer: Self-pay | Admitting: *Deleted

## 2019-09-02 DIAGNOSIS — K219 Gastro-esophageal reflux disease without esophagitis: Secondary | ICD-10-CM

## 2019-09-02 MED ORDER — OMEPRAZOLE 40 MG PO CPDR: DELAYED_RELEASE_CAPSULE | ORAL | 0 refills | Status: DC

## 2019-09-09 ENCOUNTER — Other Ambulatory Visit: Payer: Self-pay

## 2019-09-09 ENCOUNTER — Ambulatory Visit (INDEPENDENT_AMBULATORY_CARE_PROVIDER_SITE_OTHER): Payer: PPO | Admitting: Family Medicine

## 2019-09-09 ENCOUNTER — Encounter: Payer: Self-pay | Admitting: Family Medicine

## 2019-09-09 VITALS — BP 126/68 | HR 72 | Temp 98.0°F | Ht 64.0 in | Wt 158.6 lb

## 2019-09-09 DIAGNOSIS — J449 Chronic obstructive pulmonary disease, unspecified: Secondary | ICD-10-CM | POA: Diagnosis not present

## 2019-09-09 DIAGNOSIS — M5136 Other intervertebral disc degeneration, lumbar region: Secondary | ICD-10-CM | POA: Diagnosis not present

## 2019-09-09 DIAGNOSIS — M5134 Other intervertebral disc degeneration, thoracic region: Secondary | ICD-10-CM

## 2019-09-09 DIAGNOSIS — Z9889 Other specified postprocedural states: Secondary | ICD-10-CM

## 2019-09-09 DIAGNOSIS — M5441 Lumbago with sciatica, right side: Secondary | ICD-10-CM | POA: Diagnosis not present

## 2019-09-09 DIAGNOSIS — M5442 Lumbago with sciatica, left side: Secondary | ICD-10-CM | POA: Diagnosis not present

## 2019-09-09 DIAGNOSIS — G8929 Other chronic pain: Secondary | ICD-10-CM

## 2019-09-09 DIAGNOSIS — M48062 Spinal stenosis, lumbar region with neurogenic claudication: Secondary | ICD-10-CM | POA: Diagnosis not present

## 2019-09-09 DIAGNOSIS — J309 Allergic rhinitis, unspecified: Secondary | ICD-10-CM

## 2019-09-09 DIAGNOSIS — F339 Major depressive disorder, recurrent, unspecified: Secondary | ICD-10-CM | POA: Diagnosis not present

## 2019-09-09 DIAGNOSIS — K219 Gastro-esophageal reflux disease without esophagitis: Secondary | ICD-10-CM

## 2019-09-09 DIAGNOSIS — E7800 Pure hypercholesterolemia, unspecified: Secondary | ICD-10-CM
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
## 2019-09-10 LAB — CBC WITH DIFFERENTIAL/PLATELET
Basophils Absolute: 0 10*3/uL (ref 0.0–0.2)
Basos: 0 %
EOS (ABSOLUTE): 0.3 10*3/uL (ref 0.0–0.4)
Eos: 3 %
Hematocrit: 40.1 % (ref 34.0–46.6)
Hemoglobin: 13.5 g/dL (ref 11.1–15.9)
Immature Grans (Abs): 0 10*3/uL (ref 0.0–0.1)
Immature Granulocytes: 0 %
Lymphocytes Absolute: 3 10*3/uL (ref 0.7–3.1)
Lymphs: 35 %
MCH: 30.8 pg (ref 26.6–33.0)
MCHC: 33.7 g/dL (ref 31.5–35.7)
MCV: 92 fL (ref 79–97)
Monocytes Absolute: 0.8 10*3/uL (ref 0.1–0.9)
Monocytes: 9 %
Neutrophils Absolute: 4.4 10*3/uL (ref 1.4–7.0)
Neutrophils: 53 %
Platelets: 244 10*3/uL (ref 150–450)
RBC: 4.38 x10E6/uL (ref 3.77–5.28)
RDW: 13.1 % (ref 11.7–15.4)
WBC: 8.6 10*3/uL (ref 3.4–10.8)

## 2019-09-10 LAB — CMP14+EGFR
ALT: 5 IU/L (ref 0–32)
AST: 9 IU/L (ref 0–40)
Albumin/Globulin Ratio: 1.5 (ref 1.2–2.2)
Albumin: 4 g/dL (ref 3.8–4.9)
Alkaline Phosphatase: 120 IU/L — ABNORMAL HIGH (ref 39–117)
BUN/Creatinine Ratio: 7 — ABNORMAL LOW (ref 9–23)
BUN: 5 mg/dL — ABNORMAL LOW (ref 6–24)
Bilirubin Total: <0.2 mg/dL (ref 0.0–1.2)
CO2: 21 mmol/L (ref 20–29)
Calcium: 9.5 mg/dL (ref 8.7–10.2)
Chloride: 110 mmol/L — ABNORMAL HIGH (ref 96–106)
Creatinine, Ser: 0.71 mg/dL (ref 0.57–1.00)
GFR calc Af Amer: 111 mL/min/{1.73_m2} (ref >59)
GFR calc non Af Amer: 96 mL/min/{1.73_m2} (ref >59)
Globulin, Total: 2.6 g/dL (ref 1.5–4.5)
Glucose: 88 mg/dL (ref 65–99)
Potassium: 4.3 mmol/L (ref 3.5–5.2)
Sodium: 143 mmol/L (ref 134–144)
Total Protein: 6.6 g/dL (ref 6.0–8.5)

## 2019-09-13 ENCOUNTER — Encounter: Payer: Self-pay | Admitting: Family Medicine

## 2019-09-29 DIAGNOSIS — M5416 Radiculopathy, lumbar region: Secondary | ICD-10-CM | POA: Diagnosis not present

## 2019-10-05 ENCOUNTER — Ambulatory Visit: Payer: PPO | Admitting: Physician Assistant

## 2019-10-12 ENCOUNTER — Ambulatory Visit: Payer: PPO | Admitting: Physician Assistant

## 2019-10-14 ENCOUNTER — Ambulatory Visit: Payer: PPO | Admitting: Family Medicine

## 2019-10-27 ENCOUNTER — Other Ambulatory Visit: Payer: Self-pay | Admitting: Family Medicine

## 2019-10-27 DIAGNOSIS — K219 Gastro-esophageal reflux disease without esophagitis: Secondary | ICD-10-CM

## 2019-11-01 ENCOUNTER — Other Ambulatory Visit: Payer: Self-pay | Admitting: *Deleted

## 2019-11-01 DIAGNOSIS — M542 Cervicalgia: Secondary | ICD-10-CM | POA: Diagnosis not present

## 2019-11-01 DIAGNOSIS — M545 Low back pain: Secondary | ICD-10-CM | POA: Diagnosis not present

## 2019-11-01 DIAGNOSIS — G43819 Other migraine, intractable, without status migrainosus: Secondary | ICD-10-CM

## 2019-11-02 ENCOUNTER — Other Ambulatory Visit: Payer: Self-pay | Admitting: *Deleted

## 2019-11-02 DIAGNOSIS — L509 Urticaria, unspecified: Secondary | ICD-10-CM

## 2019-11-02 MED ORDER — TOPIRAMATE 100 MG PO TABS: 100.0000 mg | ORAL_TABLET | Freq: Two times a day (BID) | ORAL | 5 refills | Status: DC

## 2019-11-02 MED ORDER — FAMOTIDINE 40 MG PO TABS: ORAL_TABLET | ORAL | 0 refills | Status: DC

## 2019-11-03 DIAGNOSIS — M5126 Other intervertebral disc displacement, lumbar region: Secondary | ICD-10-CM | POA: Diagnosis not present

## 2019-11-03 DIAGNOSIS — M5416 Radiculopathy, lumbar region: Secondary | ICD-10-CM | POA: Diagnosis not present

## 2019-11-03 DIAGNOSIS — M545 Low back pain: Secondary | ICD-10-CM | POA: Diagnosis not present

## 2019-11-03 DIAGNOSIS — M48061 Spinal stenosis, lumbar region without neurogenic claudication: Secondary | ICD-10-CM | POA: Diagnosis not present

## 2019-11-30 ENCOUNTER — Other Ambulatory Visit: Payer: Self-pay | Admitting: Family Medicine

## 2019-11-30 DIAGNOSIS — K219 Gastro-esophageal reflux disease without esophagitis: Secondary | ICD-10-CM

## 2019-12-10 ENCOUNTER — Other Ambulatory Visit: Payer: Self-pay

## 2019-12-10 ENCOUNTER — Encounter: Payer: Self-pay | Admitting: Family Medicine

## 2019-12-10 ENCOUNTER — Ambulatory Visit (INDEPENDENT_AMBULATORY_CARE_PROVIDER_SITE_OTHER): Payer: PPO | Admitting: Family Medicine

## 2019-12-10 VITALS — BP 125/70 | HR 71 | Temp 97.9°F | Ht 64.0 in | Wt 156.6 lb

## 2019-12-10 DIAGNOSIS — M5441 Lumbago with sciatica, right side: Secondary | ICD-10-CM | POA: Diagnosis not present

## 2019-12-10 DIAGNOSIS — Z1159 Encounter for screening for other viral diseases: Secondary | ICD-10-CM

## 2019-12-10 DIAGNOSIS — F339 Major depressive disorder, recurrent, unspecified: Secondary | ICD-10-CM

## 2019-12-10 DIAGNOSIS — K219 Gastro-esophageal reflux disease without esophagitis: Secondary | ICD-10-CM

## 2019-12-10 DIAGNOSIS — K5900 Constipation, unspecified: Secondary | ICD-10-CM | POA: Diagnosis not present

## 2019-12-10 DIAGNOSIS — J309 Allergic rhinitis, unspecified: Secondary | ICD-10-CM

## 2019-12-10 DIAGNOSIS — Z114 Encounter for screening for human immunodeficiency virus [HIV]: Secondary | ICD-10-CM

## 2019-12-10 DIAGNOSIS — M5442 Lumbago with sciatica, left side: Secondary | ICD-10-CM | POA: Diagnosis not present

## 2019-12-10 DIAGNOSIS — G8929 Other chronic pain: Secondary | ICD-10-CM

## 2019-12-10 DIAGNOSIS — J449 Chronic obstructive pulmonary disease, unspecified: Secondary | ICD-10-CM

## 2019-12-10 DIAGNOSIS — E7800 Pure hypercholesterolemia, unspecified: Secondary | ICD-10-CM | POA: Diagnosis not present

## 2019-12-10 MED ORDER — BUDESONIDE-FORMOTEROL FUMARATE 160-4.5 MCG/ACT IN AERO: 2.0000 | INHALATION_SPRAY | Freq: Two times a day (BID) | RESPIRATORY_TRACT | 3 refills | Status: DC

## 2019-12-10 MED ORDER — ALBUTEROL SULFATE HFA 108 (90 BASE) MCG/ACT IN AERS: 2.0000 | INHALATION_SPRAY | Freq: Four times a day (QID) | RESPIRATORY_TRACT | 5 refills | Status: DC | PRN
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
## 2019-12-14 LAB — CMP14+EGFR
ALT: 11 IU/L (ref 0–32)
AST: 13 IU/L (ref 0–40)
Albumin/Globulin Ratio: 1.7 (ref 1.2–2.2)
Albumin: 4.3 g/dL (ref 3.8–4.9)
Alkaline Phosphatase: 96 IU/L (ref 48–121)
BUN/Creatinine Ratio: 11 (ref 9–23)
BUN: 10 mg/dL (ref 6–24)
Bilirubin Total: <0.2 mg/dL (ref 0.0–1.2)
CO2: 20 mmol/L (ref 20–29)
Calcium: 9.6 mg/dL (ref 8.7–10.2)
Chloride: 108 mmol/L — ABNORMAL HIGH (ref 96–106)
Creatinine, Ser: 0.89 mg/dL (ref 0.57–1.00)
GFR calc Af Amer: 84 mL/min/{1.73_m2} (ref >59)
GFR calc non Af Amer: 73 mL/min/{1.73_m2} (ref >59)
Globulin, Total: 2.6 g/dL (ref 1.5–4.5)
Glucose: 108 mg/dL — ABNORMAL HIGH (ref 65–99)
Potassium: 4.2 mmol/L (ref 3.5–5.2)
Sodium: 142 mmol/L (ref 134–144)
Total Protein: 6.9 g/dL (ref 6.0–8.5)

## 2019-12-14 LAB — LIPID PANEL
Chol/HDL Ratio: 5.1 ratio — ABNORMAL HIGH (ref 0.0–4.4)
Cholesterol, Total: 190 mg/dL (ref 100–199)
HDL: 37 mg/dL — ABNORMAL LOW (ref >39)
LDL Chol Calc (NIH): 120 mg/dL — ABNORMAL HIGH (ref 0–99)
Triglycerides: 185 mg/dL — ABNORMAL HIGH (ref 0–149)
VLDL Cholesterol Cal: 33 mg/dL (ref 5–40)

## 2019-12-14 LAB — HEPATITIS C ANTIBODY: Hep C Virus Ab: <0.1 s/co ratio (ref 0.0–0.9)

## 2019-12-14 LAB — HIV ANTIBODY (ROUTINE TESTING W REFLEX): HIV Screen 4th Generation wRfx: NONREACTIVE

## 2019-12-20 ENCOUNTER — Other Ambulatory Visit: Payer: Self-pay

## 2019-12-20 ENCOUNTER — Ambulatory Visit: Payer: PPO | Attending: Family Medicine | Admitting: Physical Therapy

## 2019-12-20 DIAGNOSIS — R293 Abnormal posture: Secondary | ICD-10-CM | POA: Insufficient documentation

## 2019-12-20 DIAGNOSIS — M5441 Lumbago with sciatica, right side: Secondary | ICD-10-CM | POA: Diagnosis not present

## 2019-12-20 DIAGNOSIS — M5416 Radiculopathy, lumbar region: Secondary | ICD-10-CM | POA: Diagnosis not present

## 2019-12-20 DIAGNOSIS — M6281 Muscle weakness (generalized): Secondary | ICD-10-CM

## 2019-12-20 DIAGNOSIS — G8929 Other chronic pain: Secondary | ICD-10-CM

## 2019-12-20 DIAGNOSIS — M48061 Spinal stenosis, lumbar region without neurogenic claudication: Secondary | ICD-10-CM | POA: Diagnosis not present

## 2019-12-20 DIAGNOSIS — M545 Low back pain: Secondary | ICD-10-CM | POA: Diagnosis not present

## 2019-12-20 DIAGNOSIS — M5126 Other intervertebral disc displacement, lumbar region: Secondary | ICD-10-CM | POA: Diagnosis not present
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
## 2019-12-22 ENCOUNTER — Ambulatory Visit: Payer: PPO | Admitting: Physical Therapy

## 2019-12-22 ENCOUNTER — Other Ambulatory Visit: Payer: Self-pay

## 2019-12-22 ENCOUNTER — Encounter: Payer: Self-pay | Admitting: Physical Therapy

## 2019-12-22 DIAGNOSIS — M6281 Muscle weakness (generalized): Secondary | ICD-10-CM

## 2019-12-22 DIAGNOSIS — M5441 Lumbago with sciatica, right side: Secondary | ICD-10-CM

## 2019-12-22 DIAGNOSIS — G8929 Other chronic pain: Secondary | ICD-10-CM

## 2019-12-22 DIAGNOSIS — R293 Abnormal posture: Secondary | ICD-10-CM
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
## 2019-12-27 ENCOUNTER — Ambulatory Visit: Payer: PPO | Admitting: Physical Therapy

## 2019-12-27 ENCOUNTER — Encounter: Payer: Self-pay | Admitting: Physical Therapy

## 2019-12-27 ENCOUNTER — Other Ambulatory Visit: Payer: Self-pay

## 2019-12-27 DIAGNOSIS — G8929 Other chronic pain: Secondary | ICD-10-CM

## 2019-12-27 DIAGNOSIS — R293 Abnormal posture: Secondary | ICD-10-CM

## 2019-12-27 DIAGNOSIS — M6281 Muscle weakness (generalized): Secondary | ICD-10-CM

## 2019-12-27 DIAGNOSIS — M5441 Lumbago with sciatica, right side: Secondary | ICD-10-CM | POA: Diagnosis not present
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
## 2019-12-29 ENCOUNTER — Other Ambulatory Visit: Payer: Self-pay

## 2019-12-29 ENCOUNTER — Encounter: Payer: Self-pay | Admitting: Physical Therapy

## 2019-12-29 ENCOUNTER — Ambulatory Visit: Payer: PPO | Admitting: Physical Therapy

## 2019-12-29 DIAGNOSIS — M5441 Lumbago with sciatica, right side: Secondary | ICD-10-CM | POA: Diagnosis not present

## 2019-12-29 DIAGNOSIS — R293 Abnormal posture: Secondary | ICD-10-CM

## 2019-12-29 DIAGNOSIS — M6281 Muscle weakness (generalized): Secondary | ICD-10-CM

## 2019-12-29 DIAGNOSIS — G8929 Other chronic pain: Secondary | ICD-10-CM
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
## 2020-01-19 ENCOUNTER — Ambulatory Visit: Payer: PPO | Admitting: Physical Therapy

## 2020-01-21 ENCOUNTER — Other Ambulatory Visit: Payer: Self-pay | Admitting: Family Medicine

## 2020-01-21 DIAGNOSIS — L509 Urticaria, unspecified: Secondary | ICD-10-CM

## 2020-01-26 ENCOUNTER — Ambulatory Visit: Payer: PPO | Attending: Family Medicine | Admitting: Physical Therapy

## 2020-01-26 ENCOUNTER — Other Ambulatory Visit: Payer: Self-pay

## 2020-01-26 ENCOUNTER — Encounter: Payer: Self-pay | Admitting: Physical Therapy

## 2020-01-26 DIAGNOSIS — G8929 Other chronic pain: Secondary | ICD-10-CM | POA: Diagnosis not present

## 2020-01-26 DIAGNOSIS — M6281 Muscle weakness (generalized): Secondary | ICD-10-CM | POA: Diagnosis not present

## 2020-01-26 DIAGNOSIS — M5441 Lumbago with sciatica, right side: Secondary | ICD-10-CM | POA: Insufficient documentation

## 2020-01-26 DIAGNOSIS — R293 Abnormal posture: Secondary | ICD-10-CM | POA: Diagnosis not present
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
## 2020-01-31 ENCOUNTER — Encounter: Payer: Self-pay | Admitting: Physical Therapy

## 2020-01-31 ENCOUNTER — Other Ambulatory Visit: Payer: Self-pay

## 2020-01-31 ENCOUNTER — Ambulatory Visit: Payer: PPO | Admitting: Physical Therapy

## 2020-01-31 DIAGNOSIS — G8929 Other chronic pain: Secondary | ICD-10-CM

## 2020-01-31 DIAGNOSIS — M6281 Muscle weakness (generalized): Secondary | ICD-10-CM

## 2020-01-31 DIAGNOSIS — M5441 Lumbago with sciatica, right side: Secondary | ICD-10-CM | POA: Diagnosis not present

## 2020-01-31 DIAGNOSIS — R293 Abnormal posture: Secondary | ICD-10-CM
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
## 2020-02-01 ENCOUNTER — Other Ambulatory Visit: Payer: Self-pay | Admitting: Family Medicine

## 2020-02-01 DIAGNOSIS — K219 Gastro-esophageal reflux disease without esophagitis: Secondary | ICD-10-CM

## 2020-02-01 DIAGNOSIS — M545 Low back pain: Secondary | ICD-10-CM | POA: Diagnosis not present

## 2020-02-01 DIAGNOSIS — M48061 Spinal stenosis, lumbar region without neurogenic claudication: Secondary | ICD-10-CM | POA: Diagnosis not present

## 2020-02-02 ENCOUNTER — Other Ambulatory Visit: Payer: Self-pay | Admitting: *Deleted

## 2020-02-02 ENCOUNTER — Ambulatory Visit: Payer: PPO | Admitting: Physical Therapy

## 2020-02-02 ENCOUNTER — Other Ambulatory Visit: Payer: Self-pay

## 2020-02-02 ENCOUNTER — Encounter: Payer: Self-pay | Admitting: Physical Therapy

## 2020-02-02 DIAGNOSIS — M5441 Lumbago with sciatica, right side: Secondary | ICD-10-CM | POA: Diagnosis not present

## 2020-02-02 DIAGNOSIS — L509 Urticaria, unspecified: Secondary | ICD-10-CM

## 2020-02-02 DIAGNOSIS — G8929 Other chronic pain: Secondary | ICD-10-CM

## 2020-02-02 DIAGNOSIS — R293 Abnormal posture: Secondary | ICD-10-CM

## 2020-02-02 DIAGNOSIS — M6281 Muscle weakness (generalized): Secondary | ICD-10-CM

## 2020-02-02 MED ORDER — CETIRIZINE HCL 10 MG PO TABS: ORAL_TABLET | ORAL | 1 refills | Status: DC
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
## 2020-02-04 ENCOUNTER — Telehealth: Payer: PPO

## 2020-02-06 ENCOUNTER — Other Ambulatory Visit: Payer: Self-pay | Admitting: Family Medicine

## 2020-02-06 DIAGNOSIS — K219 Gastro-esophageal reflux disease without esophagitis: Secondary | ICD-10-CM

## 2020-02-07 ENCOUNTER — Ambulatory Visit: Payer: PPO | Admitting: Physical Therapy

## 2020-02-09 ENCOUNTER — Ambulatory Visit: Payer: PPO | Admitting: Physical Therapy

## 2020-02-09 DIAGNOSIS — M5126 Other intervertebral disc displacement, lumbar region: Secondary | ICD-10-CM | POA: Diagnosis not present

## 2020-02-09 DIAGNOSIS — M545 Low back pain: Secondary | ICD-10-CM | POA: Diagnosis not present

## 2020-02-09 DIAGNOSIS — M48061 Spinal stenosis, lumbar region without neurogenic claudication: Secondary | ICD-10-CM | POA: Diagnosis not present

## 2020-02-09 DIAGNOSIS — M5416 Radiculopathy, lumbar region: Secondary | ICD-10-CM | POA: Diagnosis not present

## 2020-03-01 ENCOUNTER — Other Ambulatory Visit: Payer: Self-pay | Admitting: Family Medicine

## 2020-03-01 DIAGNOSIS — K219 Gastro-esophageal reflux disease without esophagitis: Secondary | ICD-10-CM

## 2020-03-06 IMAGING — MR MR LUMBAR SPINE W/O CM
4 of 5 series · 12 of 48 positions shown · non-contrast
Comparison: Postmyelogram CT scan 08/29/2008.

CLINICAL DATA: Low back pain radiating into both legs for 2 months.

EXAM:
MRI LUMBAR SPINE WITHOUT CONTRAST
TECHNIQUE: Multiplanar, multisequence MR imaging of the lumbar spine was
performed. No intravenous contrast was administered.

[Series 3: T2 · sagittal · 4.0mm · 0.43mm/px · 3 of 15 slices shown (1 of 2)]
[im 3/15]
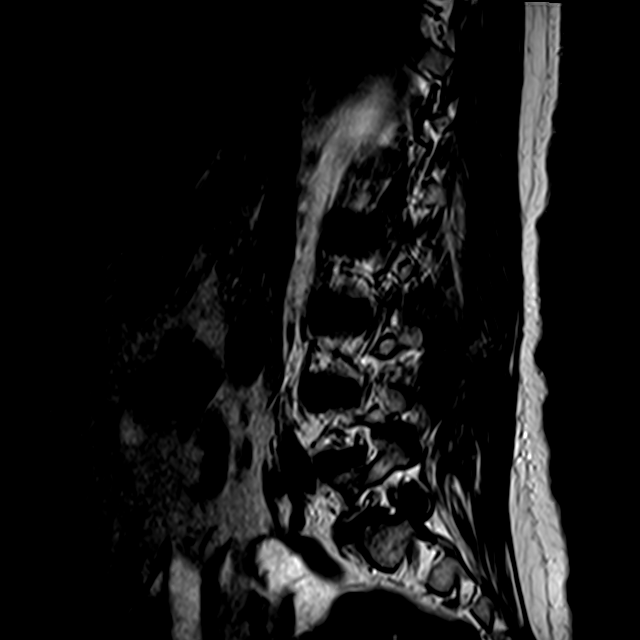
[im 8/15]
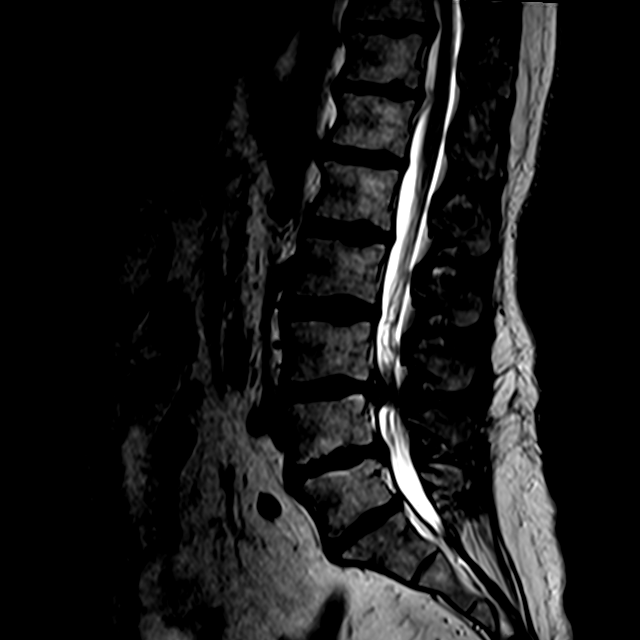
[im 12/15]
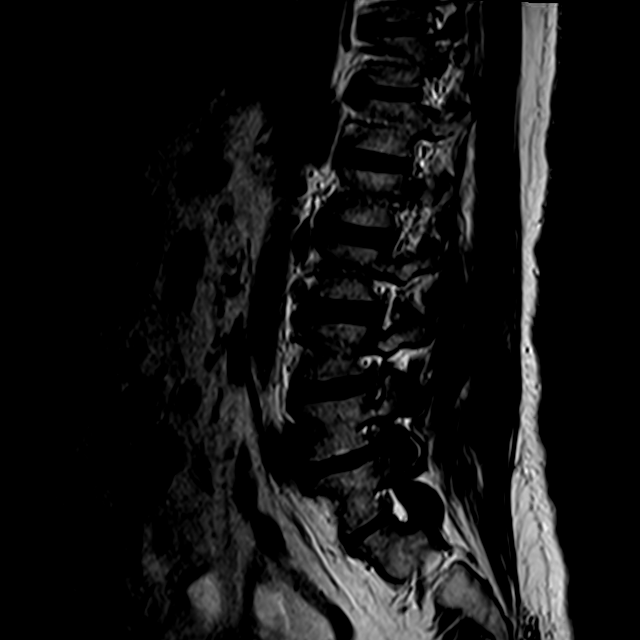

[Series 4: T1 · sagittal · 4.0mm · 0.43mm/px · 3 of 15 slices shown (1 of 2)]
[im 3/15]
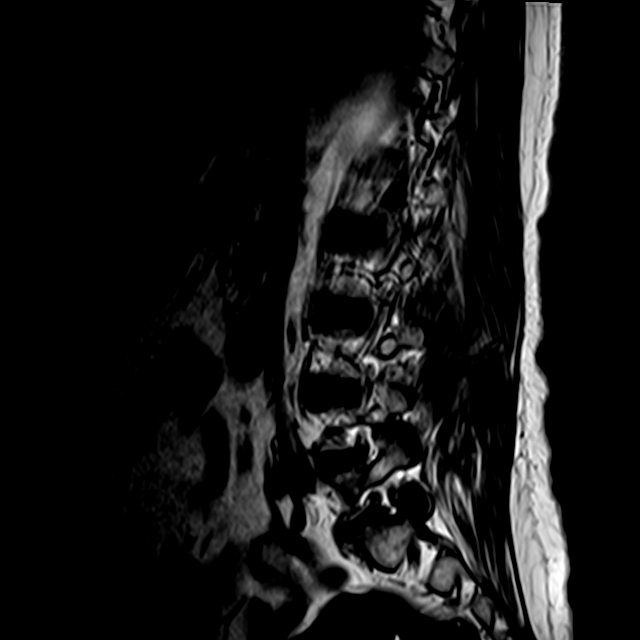
[im 9/15]
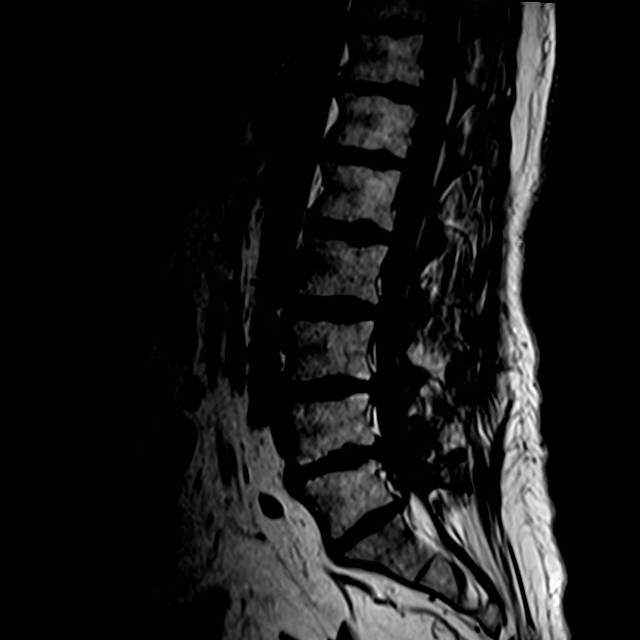
[im 15/15]
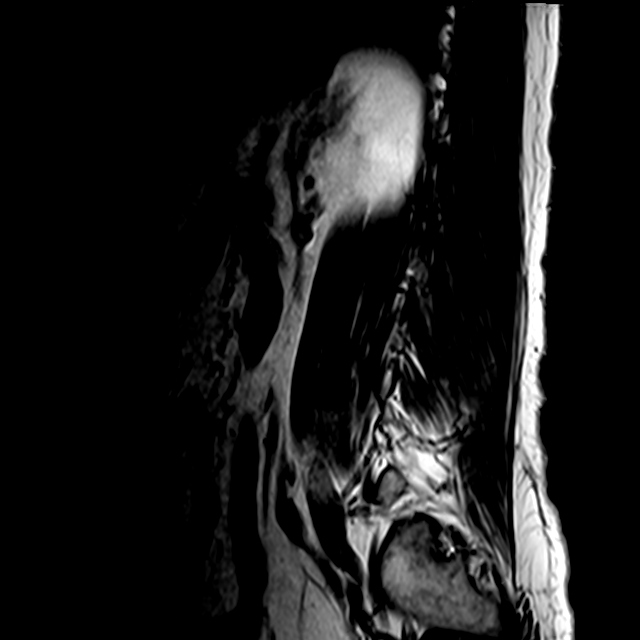

[Series 6: T2 · axial · 4.0mm · 0.22mm/px · z∈[-73,+38]mm · 3 of 36 slices shown (2 of 2)]
[im 6/36]
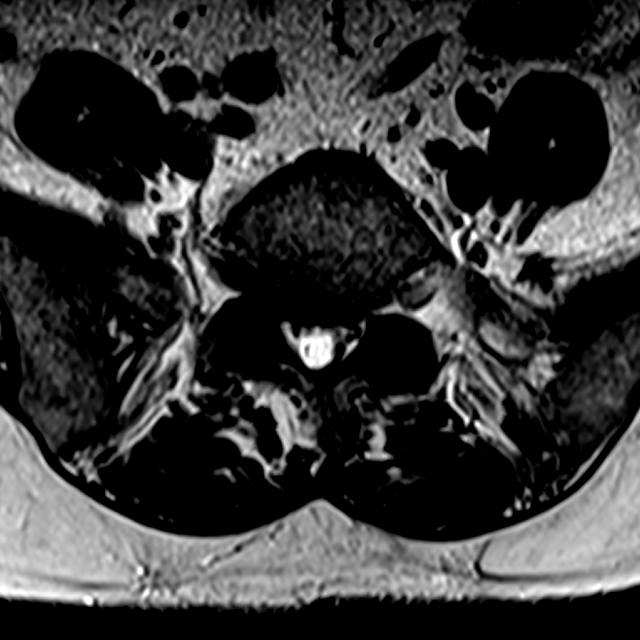
[im 19/36]
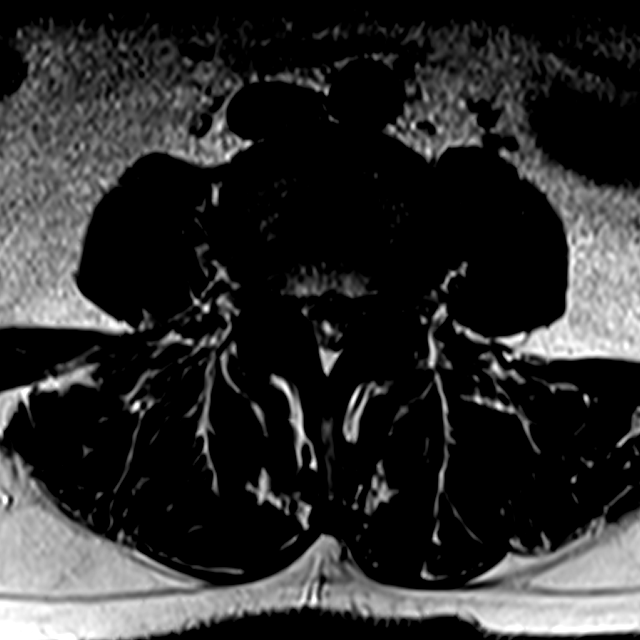
[im 30/36]
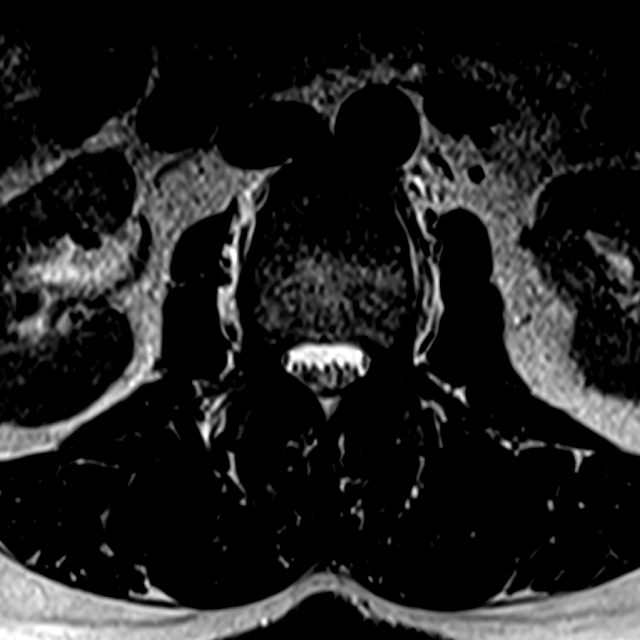

[Series 7: T1 · axial · 4.0mm · 0.22mm/px · z∈[-73,+38]mm · 3 of 36 slices shown (2 of 2)]
[im 6/36]
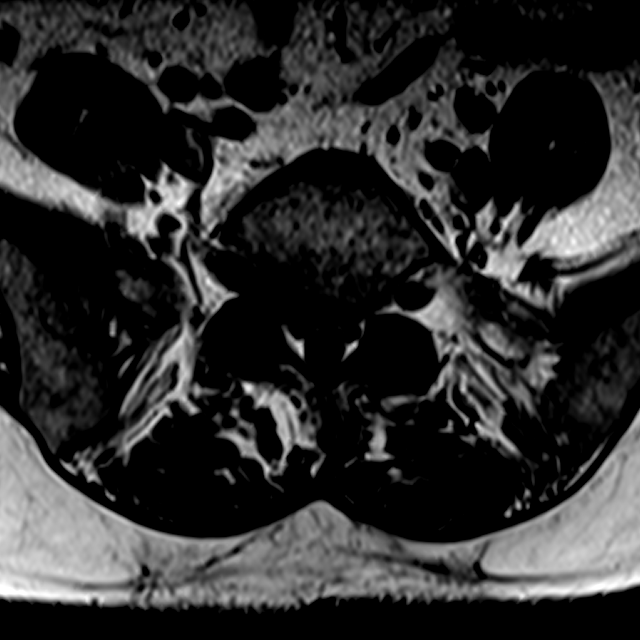
[im 19/36]
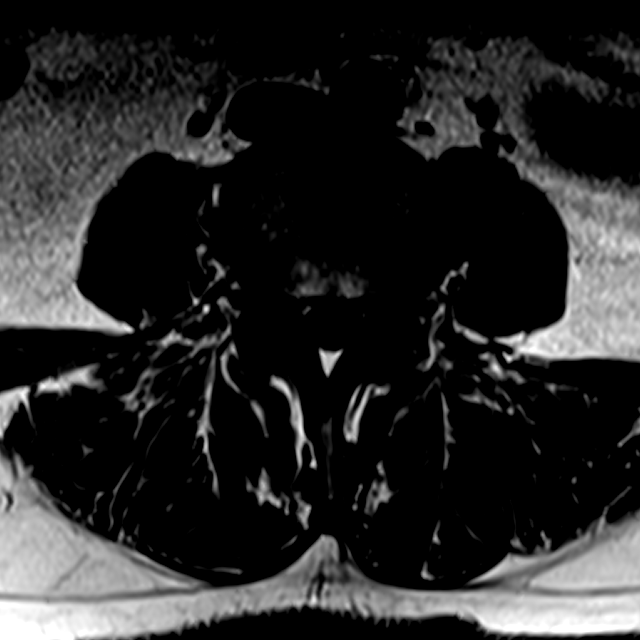
[im 30/36]
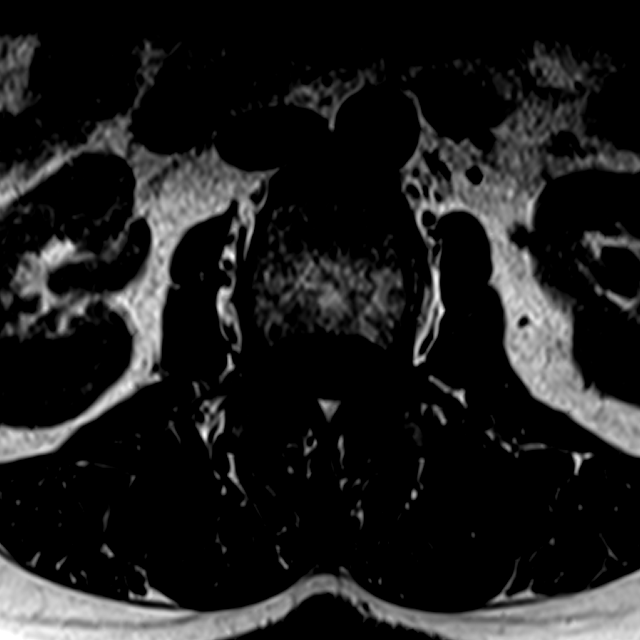

[12 of 48 positions shown; findings below may reference images not displayed]

FINDINGS: Segmentation:  Standard.

Alignment:  Maintained.

Vertebrae:  No fracture, evidence of discitis, or bone lesion.

Conus medullaris and cauda equina: Conus extends to the L1 level.
Conus and cauda equina appear normal.

Paraspinal and other soft tissues: Negative.

Disc levels:

T10-11, T11-12 and T12-L1 are imaged in the sagittal plane only.
There is a right paracentral protrusion at T10-11, a left
paracentral protrusion at T11-12 and a very small left paracentral
protrusion at T12-L1. Largest protrusion is at T11-12 where disc
appears to efface the ventral thecal sac. Note is made that the
patient is status post right laminotomy at T10-11 on the prior exam.

L1-2: Shallow disc bulge and mild facet arthropathy. No stenosis.

L2-3: There is moderate facet arthropathy and a shallow disc bulge.
Mild central canal and left subarticular recess narrowing are seen.
The foramina are open. No change since the prior exam.

L3-4: There is a broad-based disc bulge and superimposed right
subarticular recess protrusion with caudal extension. Ligamentum
flavum thickening and facet arthropathy are present. There is
moderately severe to severe central canal stenosis and severe
narrowing in the right subarticular recesses due to the patient's
disc protrusion. There is also narrowing in the left subarticular
recess which could impact the left L4 root. Mild to moderate
foraminal narrowing is worse on the right. The appearance is worse
than on the prior exam.

L4-5: Broad-based central protrusion with caudal extension centrally
and to the left. Facet arthropathy is present and some ligamentum
flavum thickening. Mild central canal narrowing is seen and there is
right worse than left subarticular recess stenosis which could
impact either descending L5 root. Moderately severe to severe
bilateral foraminal narrowing could impact either exiting L4 root.

L5-S1: Shallow disc bulge and mild-to-moderate facet degenerative
disease. The central canal and left foramen are open. Mild right
foraminal narrowing noted.
IMPRESSION: 1. Spondylosis is worst and has progressed at L3-4. There is
moderately severe to severe central canal stenosis, marked narrowing
in the subarticular recesses is worse on the right and could impact
either descending L4 root. Mild to moderate foraminal narrowing at
this level is worse on the right.
2. Mild central canal narrowing and right worse than left
subarticular recess stenosis at L4-5 where there is also moderately
severe to severe bilateral foraminal narrowing, worse on the right.
3. No change in mild central canal and left subarticular recess
narrowing at L2-3.
4. No change in mild right foraminal narrowing at L5-S1.
5. Degenerative disc disease T10-11 and T11-12 is imaged in the
sagittal plane only.

## 2020-03-14 ENCOUNTER — Ambulatory Visit: Payer: PPO | Admitting: Family Medicine

## 2020-03-14 ENCOUNTER — Other Ambulatory Visit: Payer: Self-pay

## 2020-03-14 DIAGNOSIS — F339 Major depressive disorder, recurrent, unspecified: Secondary | ICD-10-CM

## 2020-03-14 DIAGNOSIS — M5136 Other intervertebral disc degeneration, lumbar region: Secondary | ICD-10-CM

## 2020-03-14 DIAGNOSIS — M48062 Spinal stenosis, lumbar region with neurogenic claudication: Secondary | ICD-10-CM

## 2020-03-14 MED ORDER — DULOXETINE HCL 60 MG PO CPEP: 60.0000 mg | ORAL_CAPSULE | Freq: Two times a day (BID) | ORAL | 0 refills | Status: DC

## 2020-03-15 ENCOUNTER — Ambulatory Visit: Payer: PPO | Admitting: Nurse Practitioner

## 2020-04-06 ENCOUNTER — Ambulatory Visit (INDEPENDENT_AMBULATORY_CARE_PROVIDER_SITE_OTHER): Payer: PPO

## 2020-04-06 ENCOUNTER — Other Ambulatory Visit: Payer: Self-pay

## 2020-04-06 DIAGNOSIS — Z23 Encounter for immunization: Secondary | ICD-10-CM | POA: Diagnosis not present

## 2020-04-10 ENCOUNTER — Other Ambulatory Visit: Payer: Self-pay | Admitting: Family Medicine

## 2020-04-10 DIAGNOSIS — K219 Gastro-esophageal reflux disease without esophagitis: Secondary | ICD-10-CM

## 2020-04-10 DIAGNOSIS — E7800 Pure hypercholesterolemia, unspecified: Secondary | ICD-10-CM

## 2020-05-09 ENCOUNTER — Other Ambulatory Visit: Payer: Self-pay | Admitting: Family Medicine

## 2020-05-09 DIAGNOSIS — E7800 Pure hypercholesterolemia, unspecified: Secondary | ICD-10-CM

## 2020-05-10 DIAGNOSIS — M48061 Spinal stenosis, lumbar region without neurogenic claudication: Secondary | ICD-10-CM | POA: Diagnosis not present

## 2020-05-23 ENCOUNTER — Other Ambulatory Visit: Payer: Self-pay | Admitting: Family Medicine

## 2020-05-23 DIAGNOSIS — K219 Gastro-esophageal reflux disease without esophagitis: Secondary | ICD-10-CM

## 2020-05-23 DIAGNOSIS — E7800 Pure hypercholesterolemia, unspecified: Secondary | ICD-10-CM

## 2020-05-23 DIAGNOSIS — L509 Urticaria, unspecified: Secondary | ICD-10-CM

## 2020-05-30 IMAGING — RF DG LUMBAR SPINE 2-3V
1 series · 2 of 2 positions shown · non-contrast
Comparison: None.

CLINICAL DATA: Surgical lumbar fusion.

EXAM:
LUMBAR SPINE - 2-3 VIEW; DG C-ARM 1-60 MIN
FLUOROSCOPY TIME:  57 seconds.

[Series 1: run · 2 of 2 slices shown]
[im 1/2]
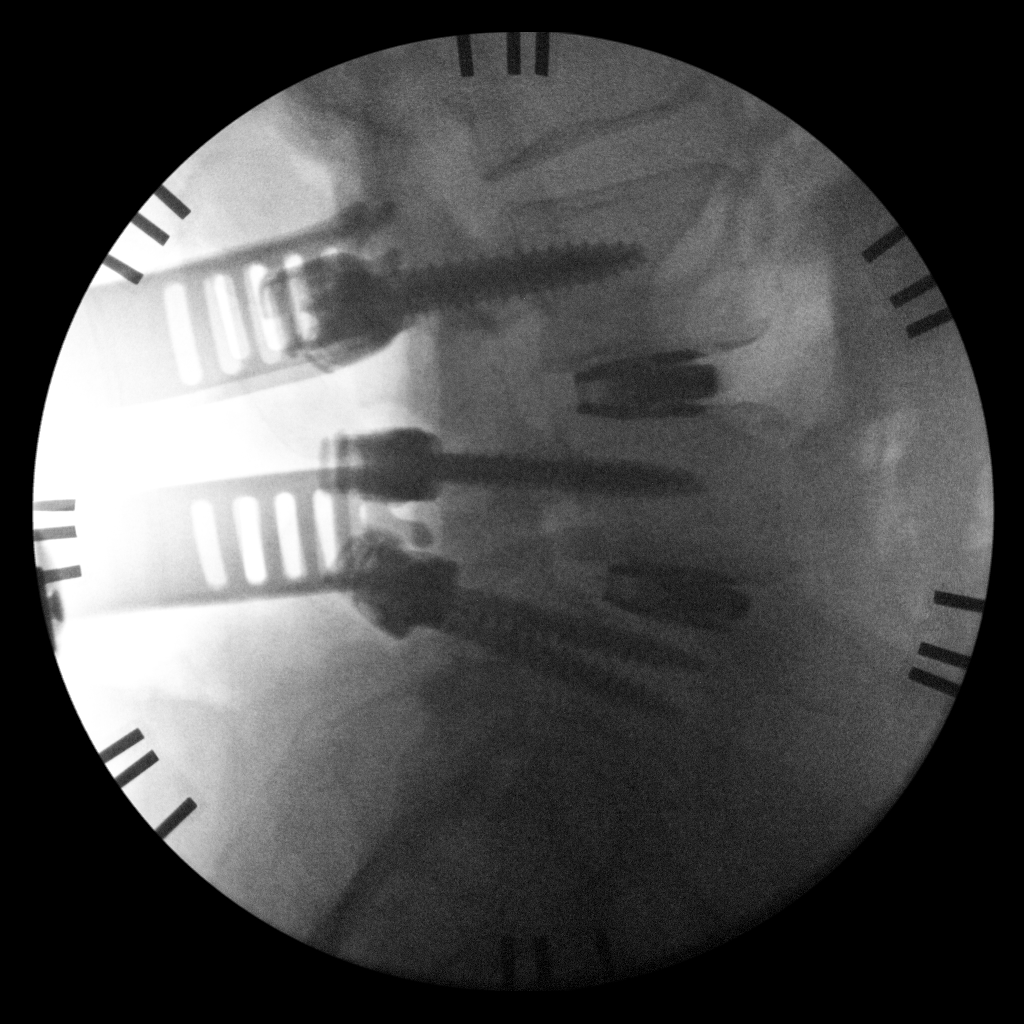
[im 2/2]
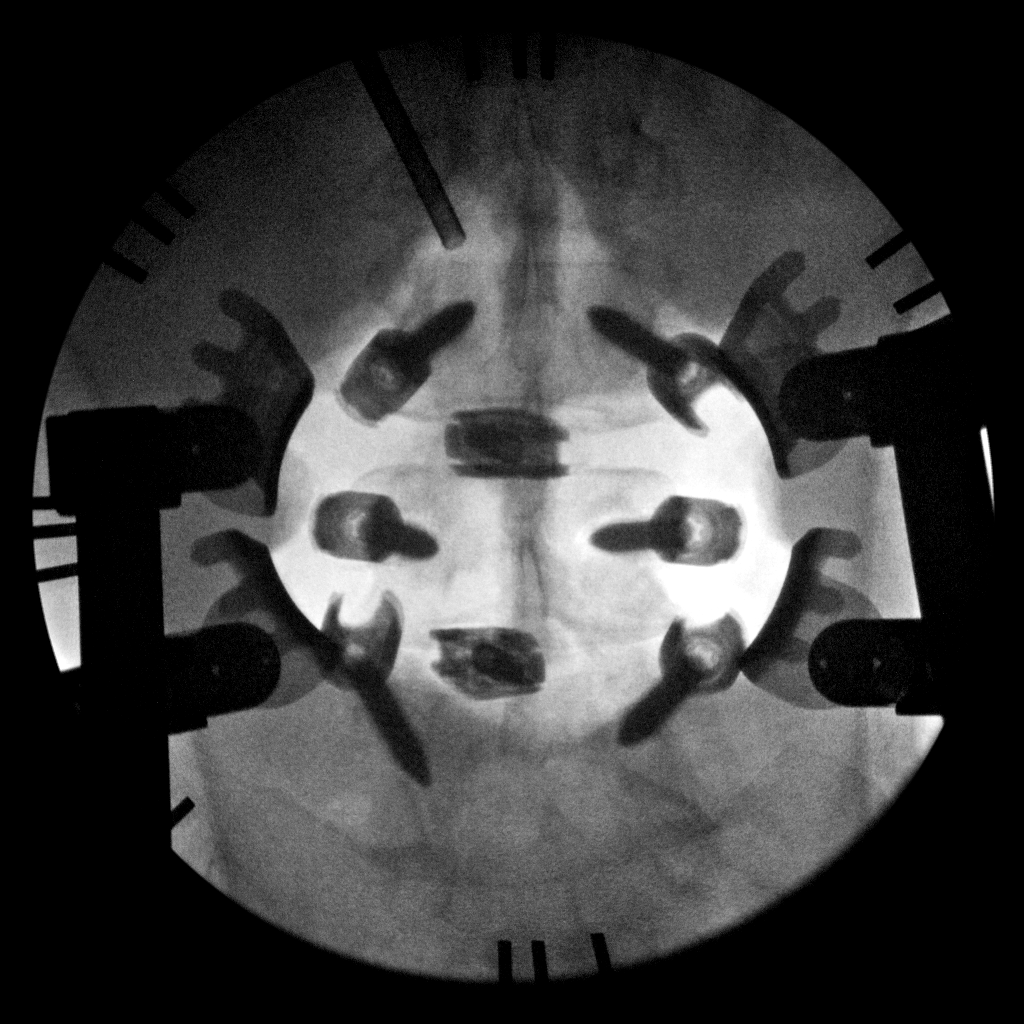

[2 of 2 positions shown; findings below may reference images not displayed]

FINDINGS: Two intraoperative fluoroscopic images demonstrate intrapedicular
screw placement bilaterally at L3, L4 and L5. Interbody fusion of
L3-4 and L4-5 is noted as well.
IMPRESSION: Bilateral intrapedicular screw placement as described above.

## 2020-06-15 ENCOUNTER — Other Ambulatory Visit: Payer: Self-pay | Admitting: Family Medicine

## 2020-06-15 DIAGNOSIS — E7800 Pure hypercholesterolemia, unspecified: Secondary | ICD-10-CM

## 2020-06-15 DIAGNOSIS — K219 Gastro-esophageal reflux disease without esophagitis: Secondary | ICD-10-CM

## 2020-06-15 DIAGNOSIS — L509 Urticaria, unspecified: Secondary | ICD-10-CM

## 2020-06-16 NOTE — Telephone Encounter (Signed)
[place]. NTBS 30 days given 05/23/20 

## 2020-07-12 ENCOUNTER — Other Ambulatory Visit: Payer: Self-pay | Admitting: Family Medicine

## 2020-07-12 DIAGNOSIS — L509 Urticaria, unspecified: Secondary | ICD-10-CM

## 2020-07-12 DIAGNOSIS — E7800 Pure hypercholesterolemia, unspecified: Secondary | ICD-10-CM

## 2020-07-12 DIAGNOSIS — K219 Gastro-esophageal reflux disease without esophagitis: Secondary | ICD-10-CM

## 2020-07-12 DIAGNOSIS — J449 Chronic obstructive pulmonary disease, unspecified: Secondary | ICD-10-CM

## 2020-07-13 ENCOUNTER — Other Ambulatory Visit: Payer: Self-pay

## 2020-07-13 ENCOUNTER — Encounter: Payer: Self-pay | Admitting: Family Medicine

## 2020-07-13 ENCOUNTER — Ambulatory Visit (INDEPENDENT_AMBULATORY_CARE_PROVIDER_SITE_OTHER): Payer: Medicare HMO

## 2020-07-13 ENCOUNTER — Ambulatory Visit (INDEPENDENT_AMBULATORY_CARE_PROVIDER_SITE_OTHER): Payer: Medicare HMO | Admitting: Family Medicine

## 2020-07-13 VITALS — BP 156/81 | HR 85 | Temp 98.0°F | Ht 64.0 in | Wt 174.0 lb

## 2020-07-13 DIAGNOSIS — G8929 Other chronic pain: Secondary | ICD-10-CM | POA: Insufficient documentation

## 2020-07-13 DIAGNOSIS — M25511 Pain in right shoulder: Secondary | ICD-10-CM

## 2020-07-13 DIAGNOSIS — R030 Elevated blood-pressure reading, without diagnosis of hypertension: Secondary | ICD-10-CM | POA: Diagnosis not present

## 2020-07-13 DIAGNOSIS — I10 Essential (primary) hypertension: Secondary | ICD-10-CM | POA: Insufficient documentation

## 2020-07-13 MED ORDER — DICLOFENAC SODIUM 75 MG PO TBEC: 75.0000 mg | DELAYED_RELEASE_TABLET | Freq: Two times a day (BID) | ORAL | 1 refills | Status: DC

## 2020-07-13 NOTE — Patient Instructions (Signed)
DASH Eating Plan DASH stands for Dietary Approaches to Stop Hypertension. The DASH eating plan is a healthy eating plan that has been shown to:  Reduce high blood pressure (hypertension).  Reduce your risk for type 2 diabetes, heart disease, and stroke.  Help with weight loss. What are tips for following this plan? Reading food labels  Check food labels for the amount of salt (sodium) per serving. Choose foods with less than 5 percent of the Daily Value of sodium. Generally, foods with less than 300 milligrams (mg) of sodium per serving fit into this eating plan.  To find whole grains, look for the word "whole" as the first word in the ingredient list. Shopping  Buy products labeled as "low-sodium" or "no salt added."  Buy fresh foods. Avoid canned foods and pre-made or frozen meals. Cooking  Avoid adding salt when cooking. Use salt-free seasonings or herbs instead of table salt or sea salt. Check with your health care provider or pharmacist before using salt substitutes.  Do not fry foods. Cook foods using healthy methods such as baking, boiling, grilling, roasting, and broiling instead.  Cook with heart-healthy oils, such as olive, canola, avocado, soybean, or sunflower oil. Meal planning  Eat a balanced diet that includes: ? 4 or more servings of fruits and 4 or more servings of vegetables each day. Try to fill one-half of your plate with fruits and vegetables. ? 6-8 servings of whole grains each day. ? Less than 6 oz (170 g) of lean meat, poultry, or fish each day. A 3-oz (85-g) serving of meat is about the same size as a deck of cards. One egg equals 1 oz (28 g). ? 2-3 servings of low-fat dairy each day. One serving is 1 cup (237 mL). ? 1 serving of nuts, seeds, or beans 5 times each week. ? 2-3 servings of heart-healthy fats. Healthy fats called omega-3 fatty acids are found in foods such as walnuts, flaxseeds, fortified milks, and eggs. These fats are also found in  cold-water fish, such as sardines, salmon, and mackerel.  Limit how much you eat of: ? Canned or prepackaged foods. ? Food that is high in trans fat, such as some fried foods. ? Food that is high in saturated fat, such as fatty meat. ? Desserts and other sweets, sugary drinks, and other foods with added sugar. ? Full-fat dairy products.  Do not salt foods before eating.  Do not eat more than 4 egg yolks a week.  Try to eat at least 2 vegetarian meals a week.  Eat more home-cooked food and less restaurant, buffet, and fast food.   Lifestyle  When eating at a restaurant, ask that your food be prepared with less salt or no salt, if possible.  If you drink alcohol: ? Limit how much you use to:  0-1 drink a day for women who are not pregnant.  0-2 drinks a day for men. ? Be aware of how much alcohol is in your drink. In the [place] one drink equals one 12 oz bottle of beer (355 mL), one 5 oz glass of wine (148 mL), or one 1 oz glass of hard liquor (44 mL). General information  Avoid eating more than 2,300 mg of salt a day. If you have hypertension, you may need to reduce your sodium intake to 1,500 mg a day.  Work with your health care provider to maintain a healthy body weight or to lose weight. Ask what an ideal weight is for you.    Get at least 30 minutes of exercise that causes your heart to beat faster (aerobic exercise) most days of the week. Activities may include walking, swimming, or biking.  Work with your health care provider or dietitian to adjust your eating plan to your individual calorie needs. What foods should I eat? Fruits All fresh, dried, or frozen fruit. Canned fruit in natural juice (without added sugar). Vegetables Fresh or frozen vegetables (raw, steamed, roasted, or grilled). Low-sodium or reduced-sodium tomato and vegetable juice. Low-sodium or reduced-sodium tomato sauce and tomato paste. Low-sodium or reduced-sodium canned vegetables. Grains Whole-grain  or whole-wheat bread. Whole-grain or whole-wheat pasta. Brown rice. Oatmeal. Quinoa. Bulgur. Whole-grain and low-sodium cereals. Pita bread. Low-fat, low-sodium crackers. Whole-wheat flour tortillas. Meats and other proteins Skinless chicken or turkey. Ground chicken or turkey. Pork with fat trimmed off. Fish and seafood. Egg whites. Dried beans, peas, or lentils. Unsalted nuts, nut butters, and seeds. Unsalted canned beans. Lean cuts of beef with fat trimmed off. Low-sodium, lean precooked or cured meat, such as sausages or meat loaves. Dairy Low-fat (1%) or fat-free (skim) milk. Reduced-fat, low-fat, or fat-free cheeses. Nonfat, low-sodium ricotta or cottage cheese. Low-fat or nonfat yogurt. Low-fat, low-sodium cheese. Fats and oils Soft margarine without trans fats. Vegetable oil. Reduced-fat, low-fat, or light mayonnaise and salad dressings (reduced-sodium). Canola, safflower, olive, avocado, soybean, and sunflower oils. Avocado. Seasonings and condiments Herbs. Spices. Seasoning mixes without salt. Other foods Unsalted popcorn and pretzels. Fat-free sweets. The items listed above may not be a complete list of foods and beverages you can eat. Contact a dietitian for more information. What foods should I avoid? Fruits Canned fruit in a light or heavy syrup. Fried fruit. Fruit in cream or butter sauce. Vegetables Creamed or fried vegetables. Vegetables in a cheese sauce. Regular canned vegetables (not low-sodium or reduced-sodium). Regular canned tomato sauce and paste (not low-sodium or reduced-sodium). Regular tomato and vegetable juice (not low-sodium or reduced-sodium). Pickles. Olives. Grains Baked goods made with fat, such as croissants, muffins, or some breads. Dry pasta or rice meal packs. Meats and other proteins Fatty cuts of meat. Ribs. Fried meat. Bacon. Bologna, salami, and other precooked or cured meats, such as sausages or meat loaves. Fat from the back of a pig (fatback).  Bratwurst. Salted nuts and seeds. Canned beans with added salt. Canned or smoked fish. Whole eggs or egg yolks. Chicken or turkey with skin. Dairy Whole or 2% milk, cream, and half-and-half. Whole or full-fat cream cheese. Whole-fat or sweetened yogurt. Full-fat cheese. Nondairy creamers. Whipped toppings. Processed cheese and cheese spreads. Fats and oils Butter. Stick margarine. Lard. Shortening. Ghee. Bacon fat. Tropical oils, such as coconut, palm kernel, or palm oil. Seasonings and condiments Onion salt, garlic salt, seasoned salt, table salt, and sea salt. Worcestershire sauce. Tartar sauce. Barbecue sauce. Teriyaki sauce. Soy sauce, including reduced-sodium. Steak sauce. Canned and packaged gravies. Fish sauce. Oyster sauce. Cocktail sauce. Store-bought horseradish. Ketchup. Mustard. Meat flavorings and tenderizers. Bouillon cubes. Hot sauces. Pre-made or packaged marinades. Pre-made or packaged taco seasonings. Relishes. Regular salad dressings. Other foods Salted popcorn and pretzels. The items listed above may not be a complete list of foods and beverages you should avoid. Contact a dietitian for more information. Where to find more information  National Heart, Lung, and Blood Institute: www.nhlbi.nih.gov  American Heart Association: www.heart.org  Academy of Nutrition and Dietetics: www.eatright.org  National Kidney Foundation: www.kidney.org Summary  The DASH eating plan is a healthy eating plan that has been shown to reduce high   blood pressure (hypertension). It may also reduce your risk for type 2 diabetes, heart disease, and stroke.  When on the DASH eating plan, aim to eat more fresh fruits and vegetables, whole grains, lean proteins, low-fat dairy, and heart-healthy fats.  With the DASH eating plan, you should limit salt (sodium) intake to 2,300 mg a day. If you have hypertension, you may need to reduce your sodium intake to 1,500 mg a day.  Work with your health care  provider or dietitian to adjust your eating plan to your individual calorie needs. This information is not intended to replace advice given to you by your health care provider. Make sure you discuss any questions you have with your health care provider. Document Revised: 04/09/2019 Document Reviewed: 04/09/2019 Elsevier Patient Education  2021 [place].    Shoulder Exercises Ask your health care provider which exercises are safe for you. Do exercises exactly as told by your health care provider and adjust them as directed. It is normal to feel mild stretching, pulling, tightness, or discomfort as you do these exercises. Stop right away if you feel sudden pain or your pain gets worse. Do not begin these exercises until told by your health care provider. Stretching exercises External rotation and abduction This exercise is sometimes called corner stretch. This exercise rotates your arm outward (external rotation) and moves your arm out from your body (abduction). 1. Stand in a doorway with one of your feet slightly in front of the other. This is called a staggered stance. If you cannot reach your forearms to the door frame, stand facing a corner of a room. 2. Choose one of the following positions as told by your health care provider: ? Place your hands and forearms on the door frame above your head. ? Place your hands and forearms on the door frame at the height of your head. ? Place your hands on the door frame at the height of your elbows. 3. Slowly move your weight onto your front foot until you feel a stretch across your chest and in the front of your shoulders. Keep your head and chest upright and keep your abdominal muscles tight. 4. Hold for __________ seconds. 5. To release the stretch, shift your weight to your back foot. Repeat __________ times. Complete this exercise __________ times a day.   Extension, standing 1. Stand and hold a broomstick, a cane, or a similar object behind  your back. ? Your hands should be a little wider than shoulder width apart. ? Your palms should face away from your back. 2. Keeping your elbows straight and your shoulder muscles relaxed, move the stick away from your body until you feel a stretch in your shoulders (extension). ? Avoid shrugging your shoulders while you move the stick. Keep your shoulder blades tucked down toward the middle of your back. 3. Hold for __________ seconds. 4. Slowly return to the starting position. Repeat __________ times. Complete this exercise __________ times a day. Range-of-motion exercises Pendulum 1. Stand near a wall or a surface that you can hold onto for balance. 2. Bend at the waist and let your left / right arm hang straight down. Use your other arm to support you. Keep your back straight and do not lock your knees. 3. Relax your left / right arm and shoulder muscles, and move your hips and your trunk so your left / right arm swings freely. Your arm should swing because of the motion of your body, not because you are using  your arm or shoulder muscles. 4. Keep moving your hips and trunk so your arm swings in the following directions, as told by your health care provider: ? Side to side. ? Forward and backward. ? In clockwise and counterclockwise circles. 5. Continue each motion for __________ seconds, or for as long as told by your health care provider. 6. Slowly return to the starting position. Repeat __________ times. Complete this exercise __________ times a day.   Shoulder flexion, standing 1. Stand and hold a broomstick, a cane, or a similar object. Place your hands a little more than shoulder width apart on the object. Your left / right hand should be palm up, and your other hand should be palm down. 2. Keep your elbow straight and your shoulder muscles relaxed. Push the stick up with your healthy arm to raise your left / right arm in front of your body, and then over your head until you feel a  stretch in your shoulder (flexion). ? Avoid shrugging your shoulder while you raise your arm. Keep your shoulder blade tucked down toward the middle of your back. 3. Hold for __________ seconds. 4. Slowly return to the starting position. Repeat __________ times. Complete this exercise __________ times a day.   Shoulder abduction, standing 1. Stand and hold a broomstick, a cane, or a similar object. Place your hands a little more than shoulder width apart on the object. Your left / right hand should be palm up, and your other hand should be palm down. 2. Keep your elbow straight and your shoulder muscles relaxed. Push the object across your body toward your left / right side. Raise your left / right arm to the side of your body (abduction) until you feel a stretch in your shoulder. ? Do not raise your arm above shoulder height unless your health care provider tells you to do that. ? If directed, raise your arm over your head. ? Avoid shrugging your shoulder while you raise your arm. Keep your shoulder blade tucked down toward the middle of your back. 3. Hold for __________ seconds. 4. Slowly return to the starting position. Repeat __________ times. Complete this exercise __________ times a day. Internal rotation 1. Place your left / right hand behind your back, palm up. 2. Use your other hand to dangle an exercise band, a towel, or a similar object over your shoulder. Grasp the band with your left / right hand so you are holding on to both ends. 3. Gently pull up on the band until you feel a stretch in the front of your left / right shoulder. The movement of your arm toward the center of your body is called internal rotation. ? Avoid shrugging your shoulder while you raise your arm. Keep your shoulder blade tucked down toward the middle of your back. 4. Hold for __________ seconds. 5. Release the stretch by letting go of the band and lowering your hands. Repeat __________ times. Complete this  exercise __________ times a day.   Strengthening exercises External rotation 1. Sit in a stable chair without armrests. 2. Secure an exercise band to a stable object at elbow height on your left / right side. 3. Place a soft object, such as a folded towel or a small pillow, between your left / right upper arm and your body to move your elbow about 4 inches (10 cm) away from your side. 4. Hold the end of the exercise band so it is tight and there is no slack. 5. Keeping  your elbow pressed against the soft object, slowly move your forearm out, away from your abdomen (external rotation). Keep your body steady so only your forearm moves. 6. Hold for __________ seconds. 7. Slowly return to the starting position. Repeat __________ times. Complete this exercise __________ times a day.   Shoulder abduction 1. Sit in a stable chair without armrests, or stand up. 2. Hold a __________ weight in your left / right hand, or hold an exercise band with both hands. 3. Start with your arms straight down and your left / right palm facing in, toward your body. 4. Slowly lift your left / right hand out to your side (abduction). Do not lift your hand above shoulder height unless your health care provider tells you that this is safe. ? Keep your arms straight. ? Avoid shrugging your shoulder while you do this movement. Keep your shoulder blade tucked down toward the middle of your back. 5. Hold for __________ seconds. 6. Slowly lower your arm, and return to the starting position. Repeat __________ times. Complete this exercise __________ times a day.   Shoulder extension 1. Sit in a stable chair without armrests, or stand up. 2. Secure an exercise band to a stable object in front of you so it is at shoulder height. 3. Hold one end of the exercise band in each hand. Your palms should face each other. 4. Straighten your elbows and lift your hands up to shoulder height. 5. Step back, away from the secured end of the  exercise band, until the band is tight and there is no slack. 6. Squeeze your shoulder blades together as you pull your hands down to the sides of your thighs (extension). Stop when your hands are straight down by your sides. Do not let your hands go behind your body. 7. Hold for __________ seconds. 8. Slowly return to the starting position. Repeat __________ times. Complete this exercise __________ times a day. Shoulder row 1. Sit in a stable chair without armrests, or stand up. 2. Secure an exercise band to a stable object in front of you so it is at waist height. 3. Hold one end of the exercise band in each hand. Position your palms so that your thumbs are facing the ceiling (neutral position). 4. Bend each of your elbows to a 90-degree angle (right angle) and keep your upper arms at your sides. 5. Step back until the band is tight and there is no slack. 6. Slowly pull your elbows back behind you. 7. Hold for __________ seconds. 8. Slowly return to the starting position. Repeat __________ times. Complete this exercise __________ times a day. Shoulder press-ups 1. Sit in a stable chair that has armrests. Sit upright, with your feet flat on the floor. 2. Put your hands on the armrests so your elbows are bent and your fingers are pointing forward. Your hands should be about even with the sides of your body. 3. Push down on the armrests and use your arms to lift yourself off the chair. Straighten your elbows and lift yourself up as much as you comfortably can. ? Move your shoulder blades down, and avoid letting your shoulders move up toward your ears. ? Keep your feet on the ground. As you get stronger, your feet should support less of your body weight as you lift yourself up. 4. Hold for __________ seconds. 5. Slowly lower yourself back into the chair. Repeat __________ times. Complete this exercise __________ times a day.   Wall push-ups 1. Stand  so you are facing a stable wall. Your feet  should be about one arm-length away from the wall. 2. Lean forward and place your palms on the wall at shoulder height. 3. Keep your feet flat on the floor as you bend your elbows and lean forward toward the wall. 4. Hold for __________ seconds. 5. Straighten your elbows to push yourself back to the starting position. Repeat __________ times. Complete this exercise __________ times a day.   This information is not intended to replace advice given to you by your health care provider. Make sure you discuss any questions you have with your health care provider. Document Revised: 08/28/2018 Document Reviewed: 06/05/2018 Elsevier Patient Education  2021 [employer].

## 2020-07-13 NOTE — Progress Notes (Signed)
Assessment & Plan:  1. Chronic right shoulder pain Education provided on shoulder exercises.  X-ray obtained today.  Rx'd NSAID.  Refer to PT and Ortho. - DG Shoulder Right - diclofenac (VOLTAREN) 75 MG EC tablet; Take 1 tablet (75 mg total) by mouth 2 (two) times daily.  Dispense: 60 tablet; Refill: 1 - Ambulatory referral to Physical Therapy - Ambulatory referral to Orthopedic Surgery  2. Elevated BP without diagnosis of hypertension Patient feels her blood pressure is elevated due to her pain.  Advised to monitor and if elevated at follow-up will start medication.  Education provided on the DASH diet.   Follow up plan: Return in about 6 weeks (around 08/24/2020) for BP.  [name], MSN, APRN, FNP-C [facility]  Subjective:   Patient ID: KATRICIA PREHN, female    DOB: Feb 14, 1966, 55 y.o.   MRN: [id number]  HPI: DEVYNN HESSLER is a 55 y.o. female presenting on 07/13/2020 for Shoulder Pain (Patient states she has been having right shoulder pain x 4 months./) and Numbness (Bilateral hands x 4 months/)  Patient is accompanied by her sister who she is okay with being present.  Patient reports she has been having right shoulder pain x4 months.  She denies any injury, fall, or overuse.  The pain occurs at the anterior portion.  She rates the pain 8 out of 10.  Heat, relaxation, and her hydrocodone are helpful.  Voltaren gel, deep blue, and blue emu did not help at all.  She is also having some swelling in her right hand.  Patient's blood pressure is elevated today.  She is not on medications to treat.   ROS: Negative unless specifically indicated above in HPI.   Relevant past medical history reviewed and updated as indicated.   Allergies and medications reviewed and updated.   Current Outpatient Medications:  .  albuterol (ACCUNEB) 0.63 MG/3ML nebulizer solution, Take 3 mLs (0.63 mg total) by nebulization every 6 (six) hours as needed for wheezing., Disp: 75  mL, Rfl: 12 .  albuterol (VENTOLIN HFA) 108 (90 Base) MCG/ACT inhaler, USE 2 PUFFS EVERY 6 HOURS AS NEEDED, Disp: 8.5 g, Rfl: 0 .  atorvastatin (LIPITOR) 40 MG tablet, TAKE 1 TABLET DAILY AT 6PM, Disp: 30 tablet, Rfl: 0 .  budesonide-formoterol (SYMBICORT) 160-4.5 MCG/ACT inhaler, Inhale 2 puffs into the lungs 2 (two) times daily., Disp: 1 Inhaler, Rfl: 3 .  cetirizine (ZYRTEC) 10 MG tablet, TAKE ONE (1) TABLET EACH DAY, Disp: 90 tablet, Rfl: 0 .  DULoxetine (CYMBALTA) 60 MG capsule, Take 1 capsule (60 mg total) by mouth 2 (two) times daily., Disp: 180 capsule, Rfl: 0 .  famotidine (PEPCID) 40 MG tablet, TAKE ONE (1) TABLET EACH DAY, Disp: 30 tablet, Rfl: 0 .  fluticasone (FLONASE) 50 MCG/ACT nasal spray, Place 1 spray into both nostrils 2 (two) times daily. (Patient taking differently: Place 1 spray into both nostrils at bedtime as needed (congestion.).), Disp: 16 g, Rfl: 11 .  gabapentin (NEURONTIN) 300 MG capsule, Take 3 capsules (900 mg total) by mouth 3 (three) times daily., Disp: 270 capsule, Rfl: 5 .  HYDROcodone-acetaminophen (NORCO) 10-325 MG tablet, Take 1 tablet by mouth every 4 (four) hours as needed for moderate pain., Disp: 30 tablet, Rfl: 0 .  linaclotide (LINZESS) 145 MCG CAPS capsule, Take 1 capsule (145 mcg total) by mouth daily before breakfast. (Patient taking differently: Take 145 mcg by mouth daily as needed (constipation).), Disp: 30 capsule, Rfl: 5 .  methocarbamol (ROBAXIN)  500 MG tablet, Take 1 tablet (500 mg total) by mouth every 6 (six) hours as needed for muscle spasms., Disp: 60 tablet, Rfl: 1 .  omeprazole (PRILOSEC) 40 MG capsule, TAKE ONE (1) CAPSULE EACH DAY, Disp: 30 capsule, Rfl: 0 .  topiramate (TOPAMAX) 100 MG tablet, Take 1 tablet (100 mg total) by mouth 2 (two) times daily., Disp: 60 tablet, Rfl: 5  Allergies  Allergen Reactions  . Oxycodone Shortness Of Breath and Other (See Comments)    breathing issues   . Penicillin G Rash and Other (See Comments)     Yeast Infection Did it involve swelling of the face/tongue/throat, SOB, or low BP? No Did it involve sudden or severe rash/hives, skin peeling, or any reaction on the inside of your mouth or nose? Yes Did you need to seek medical attention at a hospital or doctor's office? Yes When did it last happen?More than 20 years ago   . Aspirin Nausea And Vomiting    upset stomach    Objective:   BP (!) 156/81   Pulse 85   Temp 98 F (36.7 C) (Temporal)   Ht 5\' 4"  (1.626 m)   Wt 174 lb (78.9 kg)   SpO2 95%   BMI 29.87 kg/m    Physical Exam Vitals reviewed.  Constitutional:      General: She is not in acute distress.    Appearance: Normal appearance. She is not ill-appearing, toxic-appearing or diaphoretic.  HENT:     Head: Normocephalic and atraumatic.  Eyes:     General: No scleral icterus.       Right eye: No discharge.        Left eye: No discharge.     Conjunctiva/sclera: Conjunctivae normal.  Cardiovascular:     Rate and Rhythm: Normal rate.  Pulmonary:     Effort: Pulmonary effort is normal. No respiratory distress.  Musculoskeletal:     Right shoulder: Tenderness (anteriorly) present. No swelling, deformity, effusion, laceration or crepitus. Decreased range of motion (able to raise arm only 145 degrees very slowly; she cannot go behind her back at all).     Right hand: Swelling present.     Cervical back: Normal range of motion.     Comments: Guarding of right arm/shoulder.  Skin:    General: Skin is warm and dry.     Capillary Refill: Capillary refill takes less than 2 seconds.  Neurological:     General: No focal deficit present.     Mental Status: She is alert and oriented to person, place, and time. Mental status is at baseline.  Psychiatric:        Mood and Affect: Mood normal.        Behavior: Behavior normal.        Thought Content: Thought content normal.        Judgment: Judgment normal.

## 2020-07-18 ENCOUNTER — Ambulatory Visit: Payer: Medicare HMO | Attending: Family Medicine | Admitting: Physical Therapy

## 2020-07-18 ENCOUNTER — Other Ambulatory Visit: Payer: Self-pay

## 2020-07-18 DIAGNOSIS — M25511 Pain in right shoulder: Secondary | ICD-10-CM | POA: Diagnosis present

## 2020-07-18 DIAGNOSIS — M25611 Stiffness of right shoulder, not elsewhere classified: Secondary | ICD-10-CM | POA: Diagnosis present
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
## 2020-07-25 ENCOUNTER — Encounter: Payer: Self-pay | Admitting: Orthopedic Surgery

## 2020-08-01 ENCOUNTER — Ambulatory Visit: Payer: Medicare HMO | Admitting: Physical Therapy

## 2020-08-02 ENCOUNTER — Ambulatory Visit: Payer: Medicare HMO | Admitting: Physical Therapy

## 2020-08-13 ENCOUNTER — Other Ambulatory Visit: Payer: Self-pay | Admitting: Family Medicine

## 2020-08-13 DIAGNOSIS — L509 Urticaria, unspecified: Secondary | ICD-10-CM

## 2020-08-13 DIAGNOSIS — J449 Chronic obstructive pulmonary disease, unspecified: Secondary | ICD-10-CM

## 2020-08-13 DIAGNOSIS — K219 Gastro-esophageal reflux disease without esophagitis: Secondary | ICD-10-CM

## 2020-08-13 DIAGNOSIS — G8929 Other chronic pain: Secondary | ICD-10-CM

## 2020-08-13 DIAGNOSIS — E7800 Pure hypercholesterolemia, unspecified: Secondary | ICD-10-CM

## 2020-08-13 DIAGNOSIS — M25511 Pain in right shoulder: Secondary | ICD-10-CM

## 2020-08-14 ENCOUNTER — Other Ambulatory Visit: Payer: Self-pay | Admitting: Family Medicine

## 2020-08-14 DIAGNOSIS — L509 Urticaria, unspecified: Secondary | ICD-10-CM

## 2020-08-24 ENCOUNTER — Other Ambulatory Visit: Payer: Self-pay

## 2020-08-24 ENCOUNTER — Encounter: Payer: Medicare HMO | Admitting: Family Medicine

## 2020-08-24 ENCOUNTER — Ambulatory Visit: Payer: Medicare HMO | Admitting: Family Medicine

## 2020-08-24 DIAGNOSIS — J020 Streptococcal pharyngitis: Secondary | ICD-10-CM | POA: Diagnosis not present

## 2020-08-24 DIAGNOSIS — R059 Cough, unspecified: Secondary | ICD-10-CM | POA: Diagnosis not present

## 2020-08-24 DIAGNOSIS — J441 Chronic obstructive pulmonary disease with (acute) exacerbation: Secondary | ICD-10-CM | POA: Diagnosis not present

## 2020-08-24 NOTE — Progress Notes (Signed)
Unable to reach patient on the phone.

## 2020-09-09 ENCOUNTER — Other Ambulatory Visit: Payer: Self-pay | Admitting: Family Medicine

## 2020-09-09 DIAGNOSIS — K219 Gastro-esophageal reflux disease without esophagitis: Secondary | ICD-10-CM

## 2020-09-09 DIAGNOSIS — G8929 Other chronic pain: Secondary | ICD-10-CM

## 2020-09-12 DIAGNOSIS — R059 Cough, unspecified: Secondary | ICD-10-CM | POA: Diagnosis not present

## 2020-09-12 DIAGNOSIS — R0602 Shortness of breath: Secondary | ICD-10-CM | POA: Diagnosis not present

## 2020-09-12 DIAGNOSIS — J441 Chronic obstructive pulmonary disease with (acute) exacerbation: Secondary | ICD-10-CM | POA: Diagnosis not present

## 2020-09-15 ENCOUNTER — Other Ambulatory Visit: Payer: Self-pay | Admitting: Family Medicine

## 2020-09-15 DIAGNOSIS — E7800 Pure hypercholesterolemia, unspecified: Secondary | ICD-10-CM

## 2020-09-15 DIAGNOSIS — J449 Chronic obstructive pulmonary disease, unspecified: Secondary | ICD-10-CM

## 2020-09-15 DIAGNOSIS — L509 Urticaria, unspecified: Secondary | ICD-10-CM

## 2020-09-15 DIAGNOSIS — M48062 Spinal stenosis, lumbar region with neurogenic claudication: Secondary | ICD-10-CM

## 2020-09-15 DIAGNOSIS — M5136 Other intervertebral disc degeneration, lumbar region: Secondary | ICD-10-CM

## 2020-09-15 DIAGNOSIS — F339 Major depressive disorder, recurrent, unspecified: Secondary | ICD-10-CM

## 2020-10-09 ENCOUNTER — Other Ambulatory Visit: Payer: Self-pay | Admitting: Family Medicine

## 2020-10-09 DIAGNOSIS — K219 Gastro-esophageal reflux disease without esophagitis: Secondary | ICD-10-CM

## 2020-10-09 DIAGNOSIS — F339 Major depressive disorder, recurrent, unspecified: Secondary | ICD-10-CM

## 2020-10-09 DIAGNOSIS — M5136 Other intervertebral disc degeneration, lumbar region: Secondary | ICD-10-CM

## 2020-10-09 DIAGNOSIS — M48062 Spinal stenosis, lumbar region with neurogenic claudication: Secondary | ICD-10-CM

## 2020-10-09 DIAGNOSIS — G8929 Other chronic pain: Secondary | ICD-10-CM

## 2020-10-09 DIAGNOSIS — M25511 Pain in right shoulder: Secondary | ICD-10-CM

## 2020-10-11 NOTE — Telephone Encounter (Signed)
Susan Ward. NTBS 30 days given 09/15/20

## 2020-10-11 NOTE — Telephone Encounter (Signed)
Attempted to contact patient - NA °

## 2020-10-23 DIAGNOSIS — M48061 Spinal stenosis, lumbar region without neurogenic claudication: Secondary | ICD-10-CM | POA: Diagnosis not present

## 2020-10-23 DIAGNOSIS — M5416 Radiculopathy, lumbar region: Secondary | ICD-10-CM | POA: Diagnosis not present

## 2020-11-09 ENCOUNTER — Other Ambulatory Visit: Payer: Self-pay | Admitting: Family Medicine

## 2020-11-09 DIAGNOSIS — G8929 Other chronic pain: Secondary | ICD-10-CM

## 2020-11-09 DIAGNOSIS — K219 Gastro-esophageal reflux disease without esophagitis: Secondary | ICD-10-CM

## 2020-11-10 NOTE — Telephone Encounter (Signed)
NA

## 2020-11-10 NOTE — Telephone Encounter (Signed)
Britney. NTBS 30 days given 09/11/20

## 2020-11-21 ENCOUNTER — Other Ambulatory Visit: Payer: Self-pay | Admitting: Family Medicine

## 2020-11-21 DIAGNOSIS — M48062 Spinal stenosis, lumbar region with neurogenic claudication: Secondary | ICD-10-CM

## 2020-11-21 DIAGNOSIS — K219 Gastro-esophageal reflux disease without esophagitis: Secondary | ICD-10-CM

## 2020-11-21 DIAGNOSIS — M5136 Other intervertebral disc degeneration, lumbar region: Secondary | ICD-10-CM

## 2020-11-21 DIAGNOSIS — F339 Major depressive disorder, recurrent, unspecified: Secondary | ICD-10-CM

## 2020-11-21 DIAGNOSIS — M25511 Pain in right shoulder: Secondary | ICD-10-CM

## 2020-12-19 ENCOUNTER — Ambulatory Visit (INDEPENDENT_AMBULATORY_CARE_PROVIDER_SITE_OTHER): Payer: Medicare HMO | Admitting: Family Medicine

## 2020-12-19 ENCOUNTER — Other Ambulatory Visit: Payer: Self-pay

## 2020-12-19 ENCOUNTER — Encounter: Payer: Self-pay | Admitting: Family Medicine

## 2020-12-19 VITALS — BP 151/75 | HR 80 | Temp 97.5°F | Ht 64.0 in | Wt 173.4 lb

## 2020-12-19 DIAGNOSIS — M5136 Other intervertebral disc degeneration, lumbar region: Secondary | ICD-10-CM | POA: Diagnosis not present

## 2020-12-19 DIAGNOSIS — K219 Gastro-esophageal reflux disease without esophagitis: Secondary | ICD-10-CM

## 2020-12-19 DIAGNOSIS — E7800 Pure hypercholesterolemia, unspecified: Secondary | ICD-10-CM | POA: Diagnosis not present

## 2020-12-19 DIAGNOSIS — G8929 Other chronic pain: Secondary | ICD-10-CM

## 2020-12-19 DIAGNOSIS — M5441 Lumbago with sciatica, right side: Secondary | ICD-10-CM | POA: Diagnosis not present

## 2020-12-19 DIAGNOSIS — F339 Major depressive disorder, recurrent, unspecified: Secondary | ICD-10-CM | POA: Diagnosis not present

## 2020-12-19 DIAGNOSIS — R030 Elevated blood-pressure reading, without diagnosis of hypertension: Secondary | ICD-10-CM

## 2020-12-19 DIAGNOSIS — M48062 Spinal stenosis, lumbar region with neurogenic claudication: Secondary | ICD-10-CM | POA: Diagnosis not present

## 2020-12-19 DIAGNOSIS — M5442 Lumbago with sciatica, left side: Secondary | ICD-10-CM | POA: Diagnosis not present

## 2020-12-19 LAB — CBC WITH DIFFERENTIAL/PLATELET
Basophils Absolute: 0 10*3/uL (ref 0.0–0.2)
Basos: 0 %
EOS (ABSOLUTE): 0.2 10*3/uL (ref 0.0–0.4)
Eos: 2 %
Hematocrit: 46.7 % — ABNORMAL HIGH (ref 34.0–46.6)
Hemoglobin: 15.7 g/dL (ref 11.1–15.9)
Immature Grans (Abs): 0 10*3/uL (ref 0.0–0.1)
Immature Granulocytes: 0 %
Lymphocytes Absolute: 3.1 10*3/uL (ref 0.7–3.1)
Lymphs: 38 %
MCH: 31.1 pg (ref 26.6–33.0)
MCHC: 33.6 g/dL (ref 31.5–35.7)
MCV: 93 fL (ref 79–97)
Monocytes Absolute: 1 10*3/uL — ABNORMAL HIGH (ref 0.1–0.9)
Monocytes: 12 %
Neutrophils Absolute: 4 10*3/uL (ref 1.4–7.0)
Neutrophils: 48 %
Platelets: 225 10*3/uL (ref 150–450)
RBC: 5.05 x10E6/uL (ref 3.77–5.28)
RDW: 13.1 % (ref 11.7–15.4)
WBC: 8.3 10*3/uL (ref 3.4–10.8)

## 2020-12-19 LAB — CMP14+EGFR
ALT: 9 IU/L (ref 0–32)
AST: 14 IU/L (ref 0–40)
Albumin/Globulin Ratio: 1.9 (ref 1.2–2.2)
Albumin: 4.5 g/dL (ref 3.8–4.9)
Alkaline Phosphatase: 89 IU/L (ref 44–121)
BUN/Creatinine Ratio: 21 (ref 9–23)
BUN: 15 mg/dL (ref 6–24)
Bilirubin Total: <0.2 mg/dL (ref 0.0–1.2)
CO2: 22 mmol/L (ref 20–29)
Calcium: 9.9 mg/dL (ref 8.7–10.2)
Chloride: 107 mmol/L — ABNORMAL HIGH (ref 96–106)
Creatinine, Ser: 0.71 mg/dL (ref 0.57–1.00)
Globulin, Total: 2.4 g/dL (ref 1.5–4.5)
Glucose: 116 mg/dL — ABNORMAL HIGH (ref 65–99)
Potassium: 4.8 mmol/L (ref 3.5–5.2)
Sodium: 142 mmol/L (ref 134–144)
Total Protein: 6.9 g/dL (ref 6.0–8.5)
eGFR: 100 mL/min/{1.73_m2} (ref >59)

## 2020-12-19 LAB — LIPID PANEL
Chol/HDL Ratio: 5.6 ratio — ABNORMAL HIGH (ref 0.0–4.4)
Cholesterol, Total: 179 mg/dL (ref 100–199)
HDL: 32 mg/dL — ABNORMAL LOW (ref >39)
LDL Chol Calc (NIH): 99 mg/dL (ref 0–99)
Triglycerides: 283 mg/dL — ABNORMAL HIGH (ref 0–149)
VLDL Cholesterol Cal: 48 mg/dL — ABNORMAL HIGH (ref 5–40)

## 2020-12-19 MED ORDER — OMEPRAZOLE 40 MG PO CPDR: 40.0000 mg | DELAYED_RELEASE_CAPSULE | Freq: Every day | ORAL | 0 refills | Status: DC

## 2020-12-19 MED ORDER — DULOXETINE HCL 60 MG PO CPEP: 60.0000 mg | ORAL_CAPSULE | Freq: Two times a day (BID) | ORAL | 2 refills | Status: DC
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
## 2021-01-04 ENCOUNTER — Ambulatory Visit (INDEPENDENT_AMBULATORY_CARE_PROVIDER_SITE_OTHER): Payer: Medicare HMO

## 2021-01-04 ENCOUNTER — Encounter: Payer: Self-pay | Admitting: Family Medicine

## 2021-01-04 ENCOUNTER — Ambulatory Visit (INDEPENDENT_AMBULATORY_CARE_PROVIDER_SITE_OTHER): Payer: Medicare HMO | Admitting: Family Medicine

## 2021-01-04 ENCOUNTER — Other Ambulatory Visit: Payer: Self-pay

## 2021-01-04 VITALS — BP 146/78 | HR 69 | Temp 98.1°F | Ht 64.0 in | Wt 173.8 lb

## 2021-01-04 DIAGNOSIS — M5442 Lumbago with sciatica, left side: Secondary | ICD-10-CM

## 2021-01-04 DIAGNOSIS — M25561 Pain in right knee: Secondary | ICD-10-CM

## 2021-01-04 DIAGNOSIS — G8929 Other chronic pain: Secondary | ICD-10-CM | POA: Insufficient documentation

## 2021-01-04 DIAGNOSIS — F339 Major depressive disorder, recurrent, unspecified: Secondary | ICD-10-CM | POA: Diagnosis not present

## 2021-01-04 DIAGNOSIS — I10 Essential (primary) hypertension: Secondary | ICD-10-CM | POA: Diagnosis not present

## 2021-01-04 DIAGNOSIS — M25562 Pain in left knee: Secondary | ICD-10-CM

## 2021-01-04 DIAGNOSIS — M5441 Lumbago with sciatica, right side: Secondary | ICD-10-CM

## 2021-01-04 MED ORDER — LISINOPRIL 10 MG PO TABS: 10.0000 mg | ORAL_TABLET | Freq: Every day | ORAL | 2 refills | Status: DC

## 2021-01-04 NOTE — Patient Instructions (Signed)
Voltaren gel for your knees - you can buy this over the counter.

## 2021-01-04 NOTE — Progress Notes (Signed)
Assessment & Plan:  1. Depression, recurrent ([place]) - improving on current regimen - continue cymbalta  2. Chronic bilateral low back pain with bilateral sciatica - followed by Dr. Vertell Limber with [facility] - continue medications as prescribed- Norco, Lyrica - encouraged to follow up with Dr. Vertell Limber if she feels their regimen is not working for her  3. Chronic pain of both knees - advise use of voltaren gel on knees up to 4 times per day - DG Knee 1-2 Views Left - DG Knee 1-2 Views Right  4. Essential hypertension - initiated lisinopril - encouraged to take blood pressures at home and keep a log  Follow up plan: Return in about 4 weeks (around 02/01/2021) for follow-up of chronic medication conditions.  Lucile Crater, NP Student  Subjective:   Patient ID: Susan Ward, female    DOB: Aug 18, 1965, 55 y.o.   MRN: [id number]  HPI: Susan Ward is a 55 y.o. female presenting on 01/04/2021 for Follow-up (2 week recheck on cymbalta and follow up on back and knees/)  Anxiety/Depression: She states she feels much better since restarting her cymbalta and is no longer experiencing hot flashes and feelings of her brain beating around in her skull.  GAD 7 : Generalized Anxiety Score 01/04/2021 12/12/2019 09/09/2019 07/05/2019  Nervous, Anxious, on Edge 2 2 0 0  Control/stop worrying 1 2 0 0  Worry too much - different things 2 2 0 1  Trouble relaxing 2 2 0 0  Restless 2 2 0 1  Easily annoyed or irritable 2 2 0 1  Afraid - awful might happen 0 0 0 0  Total GAD 7 Score 11 12 0 3  Anxiety Difficulty Somewhat difficult - - Not difficult at all   Depression screen [facility] 2/9 01/04/2021 12/12/2019 09/09/2019  Decreased Interest 1 0 0  Down, Depressed, Hopeless 1 0 0  PHQ - 2 Score 2 0 0  Altered sleeping 3 2 0  Tired, decreased energy 3 2 0  Change in appetite 3 2 0  Feeling bad or failure about yourself  0 0 0  Trouble concentrating 2 0 0  Moving slowly or fidgety/restless 1 2  0  Suicidal thoughts 0 0 0  PHQ-9 Score 14 8 0  Difficult doing work/chores Somewhat difficult - -  Some recent data might be hidden   Pain: She is followed for pain by [facility], and has been taking mediations as prescribed. She states that her robaxin was discontinued by Dr. Vertell Limber. She was put on Lyrica in place of Neurontin, but it is not effective. She states her knees are now bothering her as well as her back and hip. She states her hands are tingling.  Knees: She states the pain has been occurring for 3 months. She massages them for relief. She has tried pain patches that didn't work. Her pain is worse with activity.   Hypertension: Her blood pressure is elevated today, and has been for the past several visits. She does not take her blood pressure at home.    ROS: Negative unless specifically indicated above in HPI.   Relevant past medical history reviewed and updated as indicated.   Allergies and medications reviewed and updated.   Current Outpatient Medications:    albuterol (ACCUNEB) 0.63 MG/3ML nebulizer solution, Take 3 mLs (0.63 mg total) by nebulization every 6 (six) hours as needed for wheezing., Disp: 75 mL, Rfl: 12   albuterol (VENTOLIN HFA) 108 (90  Base) MCG/ACT inhaler, USE 2 PUFFS EVERY 6 HOURS AS NEEDED, Disp: 8.5 g, Rfl: 3   atorvastatin (LIPITOR) 40 MG tablet, TAKE 1 TABLET DAILY AT 6PM, Disp: 30 tablet, Rfl: 3   budesonide-formoterol (SYMBICORT) 160-4.5 MCG/ACT inhaler, Inhale 2 puffs into the lungs 2 (two) times daily., Disp: 1 Inhaler, Rfl: 3   cetirizine (ZYRTEC) 10 MG tablet, TAKE ONE (1) TABLET EACH DAY, Disp: 90 tablet, Rfl: 1   diclofenac (VOLTAREN) 75 MG EC tablet, TAKE ONE TABLET BY MOUTH TWICE DAILY, Disp: 60 tablet, Rfl: 0   DULoxetine (CYMBALTA) 60 MG capsule, Take 1 capsule (60 mg total) by mouth 2 (two) times daily., Disp: 60 capsule, Rfl: 2   famotidine (PEPCID) 40 MG tablet, TAKE ONE (1) TABLET EACH DAY, Disp: 30 tablet, Rfl:  3   fluticasone (FLONASE) 50 MCG/ACT nasal spray, Place 1 spray into both nostrils 2 (two) times daily. (Patient taking differently: Place 1 spray into both nostrils at bedtime as needed (congestion.).), Disp: 16 g, Rfl: 11   HYDROcodone-acetaminophen (NORCO) 10-325 MG tablet, Take 1 tablet by mouth every 4 (four) hours as needed for moderate pain., Disp: 30 tablet, Rfl: 0   linaclotide (LINZESS) 145 MCG CAPS capsule, Take 1 capsule (145 mcg total) by mouth daily before breakfast. (Patient taking differently: Take 145 mcg by mouth daily as needed (constipation).), Disp: 30 capsule, Rfl: 5   lisinopril (ZESTRIL) 10 MG tablet, Take 1 tablet (10 mg total) by mouth daily., Disp: 30 tablet, Rfl: 2   methocarbamol (ROBAXIN) 500 MG tablet, Take 1 tablet (500 mg total) by mouth every 6 (six) hours as needed for muscle spasms., Disp: 60 tablet, Rfl: 1   omeprazole (PRILOSEC) 40 MG capsule, Take 1 capsule (40 mg total) by mouth daily., Disp: 90 capsule, Rfl: 0   pregabalin (LYRICA) 150 MG capsule, Take 150 mg by mouth 2 (two) times daily., Disp: , Rfl:    topiramate (TOPAMAX) 100 MG tablet, Take 1 tablet (100 mg total) by mouth 2 (two) times daily., Disp: 60 tablet, Rfl: 5  Allergies  Allergen Reactions   Oxycodone Shortness Of Breath and Other (See Comments)    breathing issues    Penicillin G Rash and Other (See Comments)    Yeast Infection Did it involve swelling of the face/tongue/throat, SOB, or low BP? No Did it involve sudden or severe rash/hives, skin peeling, or any reaction on the inside of your mouth or nose? Yes Did you need to seek medical attention at a hospital or doctor's office? Yes When did it last happen? More than 20 years ago    Aspirin Nausea And Vomiting    upset stomach    Objective:   BP (!) 146/78   Pulse 69   Temp 98.1 F (36.7 C) (Temporal)   Ht '5\' 4"'$  (1.626 m)   Wt 78.8 kg   SpO2 96%   BMI 29.83 kg/m    Physical Exam Constitutional:      General: She is not  in acute distress.    Appearance: Normal appearance. She is obese. She is not ill-appearing, toxic-appearing or diaphoretic.  HENT:     Head: Normocephalic.  Eyes:     Extraocular Movements: Extraocular movements intact.     Conjunctiva/sclera: Conjunctivae normal.     Pupils: Pupils are equal, round, and reactive to light.  Cardiovascular:     Rate and Rhythm: Normal rate and regular rhythm.     Pulses: Normal pulses.     Heart sounds: Normal heart  sounds.  Pulmonary:     Effort: Pulmonary effort is normal.     Breath sounds: Normal breath sounds.  Abdominal:     General: Bowel sounds are normal.     Palpations: Abdomen is soft.  Musculoskeletal:        General: Tenderness (bilateral knees) present.     Cervical back: Normal range of motion.     Right knee: Tenderness present.     Instability Tests: Anterior drawer test negative. Posterior drawer test negative. Anterior Lachman test negative. Medial McMurray test negative and lateral McMurray test negative.     Left knee: Tenderness present.     Instability Tests: Anterior drawer test negative. Posterior drawer test negative. Anterior Lachman test negative. Medial McMurray test negative and lateral McMurray test negative.  Skin:    General: Skin is warm and dry.  Neurological:     General: No focal deficit present.     Mental Status: She is alert and oriented to person, place, and time.  Psychiatric:        Mood and Affect: Mood normal.        Behavior: Behavior normal.        Thought Content: Thought content normal.        Judgment: Judgment normal.

## 2021-01-06 ENCOUNTER — Other Ambulatory Visit: Payer: Self-pay | Admitting: Family Medicine

## 2021-01-06 DIAGNOSIS — L509 Urticaria, unspecified: Secondary | ICD-10-CM

## 2021-01-06 DIAGNOSIS — E7800 Pure hypercholesterolemia, unspecified: Secondary | ICD-10-CM

## 2021-01-24 DIAGNOSIS — M5416 Radiculopathy, lumbar region: Secondary | ICD-10-CM | POA: Diagnosis not present

## 2021-01-24 DIAGNOSIS — M25511 Pain in right shoulder: Secondary | ICD-10-CM | POA: Diagnosis not present

## 2021-01-24 DIAGNOSIS — I10 Essential (primary) hypertension: Secondary | ICD-10-CM | POA: Diagnosis not present

## 2021-01-24 DIAGNOSIS — Z6828 Body mass index (BMI) 28.0-28.9, adult: Secondary | ICD-10-CM | POA: Diagnosis not present

## 2021-01-24 DIAGNOSIS — M48061 Spinal stenosis, lumbar region without neurogenic claudication: Secondary | ICD-10-CM | POA: Diagnosis not present

## 2021-02-01 ENCOUNTER — Other Ambulatory Visit: Payer: Self-pay | Admitting: Family Medicine

## 2021-02-01 ENCOUNTER — Encounter: Payer: Self-pay | Admitting: Family Medicine

## 2021-02-01 ENCOUNTER — Other Ambulatory Visit: Payer: Self-pay

## 2021-02-01 ENCOUNTER — Ambulatory Visit (INDEPENDENT_AMBULATORY_CARE_PROVIDER_SITE_OTHER): Payer: Medicare HMO | Admitting: Family Medicine

## 2021-02-01 VITALS — BP 157/75 | HR 64 | Temp 97.8°F | Ht 64.0 in | Wt 170.6 lb

## 2021-02-01 DIAGNOSIS — F339 Major depressive disorder, recurrent, unspecified: Secondary | ICD-10-CM | POA: Diagnosis not present

## 2021-02-01 DIAGNOSIS — K219 Gastro-esophageal reflux disease without esophagitis: Secondary | ICD-10-CM

## 2021-02-01 DIAGNOSIS — J449 Chronic obstructive pulmonary disease, unspecified: Secondary | ICD-10-CM | POA: Diagnosis not present

## 2021-02-01 DIAGNOSIS — Z23 Encounter for immunization: Secondary | ICD-10-CM | POA: Diagnosis not present

## 2021-02-01 DIAGNOSIS — E7800 Pure hypercholesterolemia, unspecified: Secondary | ICD-10-CM

## 2021-02-01 DIAGNOSIS — I10 Essential (primary) hypertension: Secondary | ICD-10-CM | POA: Diagnosis not present

## 2021-02-01 DIAGNOSIS — J302 Other seasonal allergic rhinitis: Secondary | ICD-10-CM | POA: Diagnosis not present

## 2021-02-01 DIAGNOSIS — F411 Generalized anxiety disorder: Secondary | ICD-10-CM

## 2021-02-01 DIAGNOSIS — R4701 Aphasia: Secondary | ICD-10-CM

## 2021-02-01 DIAGNOSIS — G43819 Other migraine, intractable, without status migrainosus: Secondary | ICD-10-CM

## 2021-02-01 DIAGNOSIS — M25511 Pain in right shoulder: Secondary | ICD-10-CM

## 2021-02-01 DIAGNOSIS — G8929 Other chronic pain: Secondary | ICD-10-CM

## 2021-02-01 MED ORDER — ATORVASTATIN CALCIUM 40 MG PO TABS: ORAL_TABLET | ORAL | 1 refills | Status: DC

## 2021-02-01 MED ORDER — NURTEC 75 MG PO TBDP: 75.0000 mg | ORAL_TABLET | ORAL | 2 refills | Status: DC

## 2021-02-01 MED ORDER — BUSPIRONE HCL 7.5 MG PO TABS: 7.5000 mg | ORAL_TABLET | Freq: Two times a day (BID) | ORAL | 2 refills | Status: DC

## 2021-02-01 MED ORDER — TOPIRAMATE 100 MG PO TABS: 100.0000 mg | ORAL_TABLET | Freq: Two times a day (BID) | ORAL | 1 refills | Status: DC

## 2021-02-01 MED ORDER — FAMOTIDINE 40 MG PO TABS: ORAL_TABLET | ORAL | 1 refills | Status: DC

## 2021-02-01 MED ORDER — LISINOPRIL 20 MG PO TABS: 20.0000 mg | ORAL_TABLET | Freq: Every day | ORAL | 2 refills | Status: DC

## 2021-02-01 MED ORDER — CETIRIZINE HCL 10 MG PO TABS: ORAL_TABLET | ORAL | 1 refills | Status: DC

## 2021-02-01 NOTE — Addendum Note (Signed)
Addended by: Karle Plumber on: 02/01/2021 09:43 AM   Modules accepted: Orders

## 2021-02-01 NOTE — Progress Notes (Signed)
Assessment & Plan:  1. Other migraine without status migrainosus, intractable - Nurtec samples to see if it makes an improvement with migraines LOT [id number] EXP 2024-09 - education provided on chronic migraine headaches - MR Brain W Wo Contrast; Future STAT - Ambulatory referral to Neurology  2. Essential hypertension - poorly controlled, increase lisinopril from 10 mg to 20 mg - patient to continue keeping a log of her BP at home  - lisinopril (ZESTRIL) 20 MG tablet; Take 1 tablet (20 mg total) by mouth daily.  Dispense: 30 tablet; Refill: 2  3. Pure hypercholesterolemia - continue lipitor as prescribed - atorvastatin (LIPITOR) 40 MG tablet; TAKE 1 TABLET DAILY AT 6PM  Dispense: 90 tablet; Refill: 1  4. COPD without exacerbation ([place]) - assessed readiness for tobacco cessation, not ready to quit at this time - use albuterol PRN  5. Gastroesophageal reflux disease without esophagitis - Well controlled on current regimen - continue famotidine as prescribed - famotidine (PEPCID) 40 MG tablet; 1 tablet daily  Dispense: 90 tablet; Refill: 1  6. Depression, recurrent ([place]) - improved from last visit, continue Cymbalta as prescribed  7. Generalized anxiety disorder - continue Cymbalta as prescribed, add Buspar to improve anxiety - busPIRone (BUSPAR) 7.5 MG tablet; Take 1 tablet (7.5 mg total) by mouth 2 (two) times daily.  Dispense: 60 tablet; Refill: 2  8. Seasonal allergies - Well controlled on current regimen - cetirizine (ZYRTEC) 10 MG tablet; 1 tablet daily  Dispense: 90 tablet; Refill: 1  9. Aphasia - MR Brain W Wo Contrast; Future   Follow up plan: Return in about 6 weeks (around 03/15/2021) for HTN, HA, Anxiety.  Susan Crater, NP Student  I personally was present during the history, physical exam, and medical decision-making activities of this service and have verified that the service and findings are accurately documented in the nurse practitioner student's  note.  [name], MSN, APRN, FNP-C [facility]   Subjective:   Patient ID: Susan Ward, female    DOB: 1966/04/12, 55 y.o.   MRN: [id number]  HPI: Susan Ward is a 55 y.o. female presenting on 02/01/2021, 55 y.o. for Hypertension, Hyperlipidemia (4 week follow up of chronic medical conditions  ), and Headache (Patient states she is still getting ongoing headaches daily all day. )  Anxiety/Depression: She states she is not doing well with her anxiety. She is taking her Cymbalta as prescribed.   GAD 7 : Generalized Anxiety Score 02/01/2021 01/04/2021 12/12/2019 09/09/2019  Nervous, Anxious, on Edge '1 2 2 '$ 0  Control/stop worrying '3 1 2 '$ 0  Worry too much - different things '3 2 2 '$ 0  Trouble relaxing '2 2 2 '$ 0  Restless '2 2 2 '$ 0  Easily annoyed or irritable '3 2 2 '$ 0  Afraid - awful might happen 0 0 0 0  Total GAD 7 Score '14 11 12 '$ 0  Anxiety Difficulty Somewhat difficult Somewhat difficult - -   Depression screen [facility] 2/9 02/01/2021 01/04/2021 12/12/2019  Decreased Interest 1 1 0  Down, Depressed, Hopeless 1 1 0  PHQ - 2 Score 2 2 0  Altered sleeping 0 3 2  Tired, decreased energy '3 3 2  '$ Change in appetite '1 3 2  '$ Feeling bad or failure about yourself  0 0 0  Trouble concentrating 2 2 0  Moving slowly or fidgety/restless '2 1 2  '$ Suicidal thoughts 0 0 0  PHQ-9 Score '10 14 8  '$ Difficult doing work/chores Somewhat difficult Somewhat difficult -  Some recent data might be hidden   Pain: She is followed for pain by [facility], and has been taking mediations as prescribed. She states that her robaxin was discontinued by Dr. Vertell Limber. She was placed back on gabapentin and taken off Lyrica due to hand tingling and lack of improvement in pain management.   Hypertension: She was prescribed lisinopril 10 mg daily at her last visit. She has been taking her BP at home and getting readings of 160/80. She is the same in the office today.   Headaches: She has had chronic  headaches for 10-15 years that have never been managed effectively. She states that they start at the base of her skull on the right side and progress to her frontal lobe. She states she has aura and vision disturbances, specifically black shadows around objects. She states that she takes Excedrin migraine up to 12 tablets per day with minimal relief. She was previously on Topamax for migraines but stopped taking it because it didn't work. Also previously failed treatment with Emgality. She has not seen a neurologist for her headaches. She has not had imaging of her brain.   COPD: She states she does not take her symbicort daily, only uses albuterol PRN if during allergy season. She has been on Zyrtec for 10 years. She is an active smoker, 0.5-1 ppd.  New Complaints: She is concerned about "losing her words" and forgetting things. She states she has noticed it has gotten worse over the last few weeks.    ROS: Negative unless specifically indicated above in HPI.   Relevant past medical history reviewed and updated as indicated.   Allergies and medications reviewed and updated.   Current Outpatient Medications:    albuterol (ACCUNEB) 0.63 MG/3ML nebulizer solution, Take 3 mLs (0.63 mg total) by nebulization every 6 (six) hours as needed for wheezing., Disp: 75 mL, Rfl: 12   albuterol (VENTOLIN HFA) 108 (90 Base) MCG/ACT inhaler, USE 2 PUFFS EVERY 6 HOURS AS NEEDED, Disp: 8.5 g, Rfl: 3   atorvastatin (LIPITOR) 40 MG tablet, TAKE 1 TABLET DAILY AT 6PM, Disp: 30 tablet, Rfl: 0   budesonide-formoterol (SYMBICORT) 160-4.5 MCG/ACT inhaler, Inhale 2 puffs into the lungs 2 (two) times daily., Disp: 1 Inhaler, Rfl: 3   cetirizine (ZYRTEC) 10 MG tablet, TAKE ONE (1) TABLET EACH DAY, Disp: 90 tablet, Rfl: 1   diclofenac (VOLTAREN) 75 MG EC tablet, TAKE ONE TABLET BY MOUTH TWICE DAILY, Disp: 60 tablet, Rfl: 0   DULoxetine (CYMBALTA) 60 MG capsule, Take 1 capsule (60 mg total) by mouth 2 (two) times daily.,  Disp: 60 capsule, Rfl: 2   famotidine (PEPCID) 40 MG tablet, TAKE ONE (1) TABLET EACH DAY, Disp: 30 tablet, Rfl: 0   fluticasone (FLONASE) 50 MCG/ACT nasal spray, Place 1 spray into both nostrils 2 (two) times daily. (Patient taking differently: Place 1 spray into both nostrils at bedtime as needed (congestion.).), Disp: 16 g, Rfl: 11   gabapentin (NEURONTIN) 600 MG tablet, Take 1 tablet by mouth in the morning, at noon, and at bedtime., Disp: , Rfl:    HYDROcodone-acetaminophen (NORCO) 10-325 MG tablet, Take 1 tablet by mouth every 4 (four) hours as needed for moderate pain., Disp: 30 tablet, Rfl: 0   linaclotide (LINZESS) 145 MCG CAPS capsule, Take 1 capsule (145 mcg total) by mouth daily before breakfast. (Patient taking differently: Take 145 mcg by mouth daily as needed (constipation).), Disp: 30 capsule, Rfl: 5   lisinopril (ZESTRIL) 10 MG tablet, Take  1 tablet (10 mg total) by mouth daily., Disp: 30 tablet, Rfl: 2   methocarbamol (ROBAXIN) 500 MG tablet, Take 1 tablet (500 mg total) by mouth every 6 (six) hours as needed for muscle spasms., Disp: 60 tablet, Rfl: 1   omeprazole (PRILOSEC) 40 MG capsule, Take 1 capsule (40 mg total) by mouth daily., Disp: 90 capsule, Rfl: 0   OVER THE COUNTER MEDICATION, 200 mg. Alpha- lipoic acid, Disp: , Rfl:    topiramate (TOPAMAX) 100 MG tablet, Take 1 tablet (100 mg total) by mouth 2 (two) times daily., Disp: 60 tablet, Rfl: 5  Allergies  Allergen Reactions   Oxycodone Shortness Of Breath and Other (See Comments)    breathing issues    Penicillin G Rash and Other (See Comments)    Yeast Infection Did it involve swelling of the face/tongue/throat, SOB, or low BP? No Did it involve sudden or severe rash/hives, skin peeling, or any reaction on the inside of your mouth or nose? Yes Did you need to seek medical attention at a hospital or doctor's office? Yes When did it last happen? More than 20 years ago    Aspirin Nausea And Vomiting    upset stomach     Objective:   BP (!) 157/75   Pulse 64   Temp 97.8 F (36.6 C) (Temporal)   Ht '5\' 4"'$  (1.626 m)   Wt 77.4 kg   SpO2 99%   BMI 29.28 kg/m    Physical Exam Vitals reviewed.  Constitutional:      General: She is not in acute distress.    Appearance: Normal appearance. She is overweight. She is not ill-appearing, toxic-appearing or diaphoretic.  HENT:     Head: Normocephalic and atraumatic.  Eyes:     General: No scleral icterus.       Right eye: No discharge.        Left eye: No discharge.     Conjunctiva/sclera: Conjunctivae normal.  Cardiovascular:     Rate and Rhythm: Normal rate and regular rhythm.     Heart sounds: Normal heart sounds. No murmur heard.   No friction rub. No gallop.  Pulmonary:     Effort: Pulmonary effort is normal. No respiratory distress.     Breath sounds: Normal breath sounds. No stridor. No wheezing, rhonchi or rales.  Musculoskeletal:        General: Normal range of motion.     Cervical back: Normal range of motion.  Skin:    General: Skin is warm and dry.     Capillary Refill: Capillary refill takes less than 2 seconds.  Neurological:     General: No focal deficit present.     Mental Status: She is alert and oriented to person, place, and time. Mental status is at baseline.  Psychiatric:        Mood and Affect: Mood normal.        Behavior: Behavior normal.        Thought Content: Thought content normal.        Judgment: Judgment normal.

## 2021-02-02 ENCOUNTER — Ambulatory Visit (HOSPITAL_COMMUNITY)
Admission: RE | Admit: 2021-02-02 | Discharge: 2021-02-02 | Disposition: A | Discharge status: Home / Self Care | Payer: Medicare HMO | Source: Ambulatory Visit | Attending: Family Medicine | Admitting: Family Medicine

## 2021-02-02 ENCOUNTER — Telehealth: Payer: Self-pay | Admitting: *Deleted

## 2021-02-02 DIAGNOSIS — G43909 Migraine, unspecified, not intractable, without status migrainosus: Secondary | ICD-10-CM | POA: Diagnosis not present

## 2021-02-02 DIAGNOSIS — G43819 Other migraine, intractable, without status migrainosus: Secondary | ICD-10-CM | POA: Diagnosis not present

## 2021-02-02 DIAGNOSIS — R4701 Aphasia: Secondary | ICD-10-CM | POA: Insufficient documentation

## 2021-02-02 MED ORDER — GADOBUTROL 1 MMOL/ML IV SOLN
7.5000 mL | Freq: Once | INTRAVENOUS | Status: AC | PRN
  Administered 2021-02-02: 7.5 mL via INTRAVENOUS

## 2021-02-02 NOTE — Telephone Encounter (Signed)
Please let patient know we cannot get Nurtec approved through her insurance.

## 2021-02-02 NOTE — Telephone Encounter (Signed)
Susan Ward (Key: [id number]) Nurtec '75MG'$  dispersible tablets  Prior Authorization submitted

## 2021-02-02 NOTE — Telephone Encounter (Signed)
Your request has been denied/  The drug you asked for is not listed in your preferred drug list (formulary).   The preferred drug(s), you may not have tried, are: Aimovig Autoinjector subcutaneous auto-injector.  Formulary alternative that has yet to be tried is Aimovig

## 2021-02-05 NOTE — Telephone Encounter (Signed)
Attempted to contact patient - NA °

## 2021-02-06 DIAGNOSIS — H2513 Age-related nuclear cataract, bilateral: Secondary | ICD-10-CM | POA: Diagnosis not present

## 2021-02-06 DIAGNOSIS — H40033 Anatomical narrow angle, bilateral: Secondary | ICD-10-CM | POA: Diagnosis not present

## 2021-02-20 ENCOUNTER — Telehealth: Payer: Self-pay | Admitting: Family Medicine

## 2021-02-20 NOTE — Telephone Encounter (Signed)
No answer unable to leave a message for patient to call back and schedule Medicare Annual Wellness Visit (AWV) to be completed by video or phone.   Last AWV: 06/04/2019  Please schedule at anytime with [facility] Health Advisor.  45 minute appointment  Any questions, please contact me at [phone]

## 2021-02-22 ENCOUNTER — Other Ambulatory Visit: Payer: Self-pay | Admitting: Family Medicine

## 2021-02-22 DIAGNOSIS — K219 Gastro-esophageal reflux disease without esophagitis: Secondary | ICD-10-CM

## 2021-03-15 ENCOUNTER — Ambulatory Visit (INDEPENDENT_AMBULATORY_CARE_PROVIDER_SITE_OTHER): Payer: Medicare HMO | Admitting: Family Medicine

## 2021-03-15 ENCOUNTER — Encounter: Payer: Self-pay | Admitting: Family Medicine

## 2021-03-15 ENCOUNTER — Other Ambulatory Visit: Payer: Self-pay

## 2021-03-15 VITALS — BP 126/66 | HR 71 | Temp 97.6°F | Ht 64.0 in | Wt 167.4 lb

## 2021-03-15 DIAGNOSIS — F339 Major depressive disorder, recurrent, unspecified: Secondary | ICD-10-CM

## 2021-03-15 DIAGNOSIS — M542 Cervicalgia: Secondary | ICD-10-CM

## 2021-03-15 DIAGNOSIS — F411 Generalized anxiety disorder: Secondary | ICD-10-CM

## 2021-03-15 DIAGNOSIS — G43819 Other migraine, intractable, without status migrainosus: Secondary | ICD-10-CM | POA: Diagnosis not present

## 2021-03-15 DIAGNOSIS — I10 Essential (primary) hypertension: Secondary | ICD-10-CM

## 2021-03-15 MED ORDER — METHOCARBAMOL 500 MG PO TABS: 500.0000 mg | ORAL_TABLET | Freq: Four times a day (QID) | ORAL | 2 refills | Status: DC | PRN

## 2021-03-15 MED ORDER — AIMOVIG 70 MG/ML ~~LOC~~ SOAJ: 70.0000 mg | SUBCUTANEOUS | 2 refills | Status: DC

## 2021-03-15 MED ORDER — BUSPIRONE HCL 15 MG PO TABS: 15.0000 mg | ORAL_TABLET | Freq: Two times a day (BID) | ORAL | 2 refills | Status: DC

## 2021-03-15 NOTE — Progress Notes (Signed)
Assessment & Plan:  1. Other migraine without status migrainosus, intractable - failed Nurtec in addition to insurance not authorizing it - encouraged to call and schedule appointment with neurology - phone # provided - insurance prefers Aimovig so we will try this and see if it improves migraines - Erenumab-aooe (AIMOVIG) 70 MG/ML SOAJ; Inject 70 mg into the skin every 30 (thirty) days.  Dispense: 1 mL; Refill: 2  2. Essential hypertension - Well controlled on current regimen - continue healthy diet and exercise - continue lisinopril as prescribed  3-4. Depression, recurrent (HCC)/ Generalized anxiety disorder - improving, increasing Buspar to 15 mg BID - busPIRone (BUSPAR) 15 MG tablet; Take 1 tablet (15 mg total) by mouth 2 (two) times daily.  Dispense: 60 tablet; Refill: 2  5. Musculoskeletal neck pain - encouraged to use heat in addition to Robaxin to help with tension related to headaches - methocarbamol (ROBAXIN) 500 MG tablet; Take 1 tablet (500 mg total) by mouth every 6 (six) hours as needed for muscle spasms.  Dispense: 90 tablet; Refill: 2   Follow up plan: Return in about 2 months (around 05/15/2021) for migraines & anxiety.  Lucile Crater, NP Student  I personally was present during the history, physical exam, and medical decision-making activities of this service and have verified that the service and findings are accurately documented in the nurse practitioner student's note.  [name], MSN, APRN, FNP-C [facility]   Subjective:   Patient ID: Susan Ward, female    DOB: Aug 26, 1965, 55 y.o.   MRN: [id number]  HPI: [name] is a 55 y.o. female presenting on 03/15/2021 for Hypertension, Headache, and Anxiety (6 week follow up)  Anxiety/Depression: She states she is not doing well with her anxiety. She is taking her Cymbalta as prescribed. Buspar was added at her last visit. She states this is helping her but she feels that lately  she is becoming more short tempered and has lower tolerance for interactions with others. She states she used to be more considerate of other people's feelings but now she says anything that comes to mind.   GAD 7 : Generalized Anxiety Score 03/15/2021 02/01/2021 01/04/2021 12/12/2019  Nervous, Anxious, on Edge 3 1 2 2   Control/stop worrying 3 3 1 2   Worry too much - different things 3 3 2 2   Trouble relaxing 3 2 2 2   Restless 0 2 2 2   Easily annoyed or irritable 0 3 2 2   Afraid - awful might happen 0 0 0 0  Total GAD 7 Score 12 14 11 12   Anxiety Difficulty Extremely difficult Somewhat difficult Somewhat difficult -   Depression screen [facility] 2/9 03/15/2021 02/01/2021 01/04/2021  Decreased Interest 0 1 1  Down, Depressed, Hopeless 0 1 1  PHQ - 2 Score 0 2 2  Altered sleeping 0 0 3  Tired, decreased energy 3 3 3   Change in appetite 0 1 3  Feeling bad or failure about yourself  0 0 0  Trouble concentrating 0 2 2  Moving slowly or fidgety/restless 0 2 1  Suicidal thoughts 0 0 0  PHQ-9 Score 3 10 14   Difficult doing work/chores Somewhat difficult Somewhat difficult Somewhat difficult  Some recent data might be hidden    Hypertension: She was increased to 20 mg lisinopril at her last visit. Her BP is in great range today. She states that she is taking her medication as prescribed and has also eliminated salt from her diet and  reduced intake of drinks other than water.  Headaches: She has had chronic headaches for 10-15 years that have never been managed effectively. She states that they start at the base of her skull on the right side and progress to her frontal lobe. She states she has aura and vision disturbances, specifically black shadows around objects. She states that she takes Excedrin migraine up to 12 tablets per day with minimal relief. She has a concern about lapses in her memory, which she was sent for an MRI for. Her MRI was negative for acute process, but positive for mild frontal  predominant white matter T2 hyperintensitities likely related to chronic microvascular ischemic disease and/or chronic migraines. She was previously on Topamax for migraines but stopped taking it because it didn't work. Also previously failed treatment with Emgality. She was given a referral to neurology at her last visit, but she has not yet scheduled an appointment. She was given a sample of Nurtec at her last appointment but her insurance did not approve it, although she states it didn't help her anyway. She reports ongoing muscle tension and neck pain related to her headaches, she is not taking Robaxin because she is out.   ROS: Negative unless specifically indicated above in HPI.   Relevant past medical history reviewed and updated as indicated.   Allergies and medications reviewed and updated.   Current Outpatient Medications:    albuterol (ACCUNEB) 0.63 MG/3ML nebulizer solution, Take 3 mLs (0.63 mg total) by nebulization every 6 (six) hours as needed for wheezing., Disp: 75 mL, Rfl: 12   albuterol (VENTOLIN HFA) 108 (90 Base) MCG/ACT inhaler, USE 2 PUFFS EVERY 6 HOURS AS NEEDED, Disp: 8.5 g, Rfl: 3   Alpha-Lipoic Acid 200 MG CAPS, Take 200 mg by mouth daily., Disp: , Rfl:    atorvastatin (LIPITOR) 40 MG tablet, TAKE 1 TABLET DAILY AT 6PM, Disp: 90 tablet, Rfl: 1   cetirizine (ZYRTEC) 10 MG tablet, 1 tablet daily, Disp: 90 tablet, Rfl: 1   diclofenac (VOLTAREN) 75 MG EC tablet, TAKE ONE TABLET BY MOUTH TWICE DAILY, Disp: 180 tablet, Rfl: 1   DULoxetine (CYMBALTA) 60 MG capsule, Take 1 capsule (60 mg total) by mouth 2 (two) times daily., Disp: 60 capsule, Rfl: 2   Erenumab-aooe (AIMOVIG) 70 MG/ML SOAJ, Inject 70 mg into the skin every 30 (thirty) days., Disp: 1 mL, Rfl: 2   famotidine (PEPCID) 40 MG tablet, 1 tablet daily, Disp: 90 tablet, Rfl: 1   fluticasone (FLONASE) 50 MCG/ACT nasal spray, Place 1 spray into both nostrils 2 (two) times daily. (Patient taking differently: Place 1 spray  into both nostrils at bedtime as needed (congestion.).), Disp: 16 g, Rfl: 11   gabapentin (NEURONTIN) 600 MG tablet, Take 1 tablet by mouth in the morning, at noon, and at bedtime., Disp: , Rfl:    HYDROcodone-acetaminophen (NORCO) 10-325 MG tablet, Take 1 tablet by mouth every 4 (four) hours as needed for moderate pain., Disp: 30 tablet, Rfl: 0   linaclotide (LINZESS) 145 MCG CAPS capsule, Take 1 capsule (145 mcg total) by mouth daily before breakfast. (Patient taking differently: Take 145 mcg by mouth daily as needed (constipation).), Disp: 30 capsule, Rfl: 5   lisinopril (ZESTRIL) 20 MG tablet, Take 1 tablet (20 mg total) by mouth daily., Disp: 30 tablet, Rfl: 2   omeprazole (PRILOSEC) 40 MG capsule, TAKE ONE CAPSULE BY MOUTH DAILY, Disp: 90 capsule, Rfl: 0   OVER THE COUNTER MEDICATION, 200 mg. Alpha- lipoic acid, Disp: , Rfl:  busPIRone (BUSPAR) 15 MG tablet, Take 1 tablet (15 mg total) by mouth 2 (two) times daily., Disp: 60 tablet, Rfl: 2   methocarbamol (ROBAXIN) 500 MG tablet, Take 1 tablet (500 mg total) by mouth every 6 (six) hours as needed for muscle spasms., Disp: 90 tablet, Rfl: 2  Allergies  Allergen Reactions   Oxycodone Shortness Of Breath and Other (See Comments)    breathing issues    Penicillin G Rash and Other (See Comments)    Yeast Infection Did it involve swelling of the face/tongue/throat, SOB, or low BP? No Did it involve sudden or severe rash/hives, skin peeling, or any reaction on the inside of your mouth or nose? Yes Did you need to seek medical attention at a hospital or doctor's office? Yes When did it last happen? More than 20 years ago    Aspirin Nausea And Vomiting    upset stomach    Objective:   BP 126/66   Pulse 71   Temp 97.6 F (36.4 C) (Temporal)   Ht 5\' 4"  (1.626 m)   Wt 75.9 kg   SpO2 95%   BMI 28.73 kg/m    Physical Exam Vitals reviewed.  Constitutional:      General: She is not in acute distress.    Appearance: Normal appearance.  She is overweight. She is not ill-appearing, toxic-appearing or diaphoretic.  HENT:     Head: Normocephalic and atraumatic.  Eyes:     General: No scleral icterus.       Right eye: No discharge.        Left eye: No discharge.     Conjunctiva/sclera: Conjunctivae normal.  Cardiovascular:     Rate and Rhythm: Normal rate and regular rhythm.     Heart sounds: Normal heart sounds. No murmur heard.   No friction rub. No gallop.  Pulmonary:     Effort: Pulmonary effort is normal. No respiratory distress.     Breath sounds: Normal breath sounds. No stridor. No wheezing, rhonchi or rales.  Musculoskeletal:        General: Normal range of motion.     Cervical back: Normal range of motion.  Skin:    General: Skin is warm and dry.     Capillary Refill: Capillary refill takes less than 2 seconds.  Neurological:     General: No focal deficit present.     Mental Status: She is alert and oriented to person, place, and time. Mental status is at baseline.  Psychiatric:        Mood and Affect: Mood normal.        Behavior: Behavior normal.        Thought Content: Thought content normal.        Judgment: Judgment normal.

## 2021-03-15 NOTE — Patient Instructions (Addendum)
[facility] Phone: [phone]  Increasing BuSpar: 15 mg in the AM and 7.5 in the PM x1 week, then 15 mg twice daily

## 2021-03-16 ENCOUNTER — Telehealth: Payer: Self-pay

## 2021-03-16 NOTE — Telephone Encounter (Signed)
PA Case: [id number], Status: Approved, Coverage Starts on: 03/16/2021 12:00:00 AM, Coverage Ends on: 06/14/2021 12:00:00 AM. Questions? Contact [phone].  Pharmacy aware

## 2021-03-16 NOTE — Telephone Encounter (Signed)
(  [facility]) Rx #: [id number] Aimovig 70MG /ML auto-injectors  Sent to plan

## 2021-04-02 ENCOUNTER — Other Ambulatory Visit: Payer: Self-pay | Admitting: Family Medicine

## 2021-04-02 DIAGNOSIS — F411 Generalized anxiety disorder: Secondary | ICD-10-CM

## 2021-04-02 DIAGNOSIS — I10 Essential (primary) hypertension: Secondary | ICD-10-CM
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
## 2021-04-11 ENCOUNTER — Encounter: Payer: Self-pay | Admitting: Psychiatry

## 2021-04-11 ENCOUNTER — Ambulatory Visit: Payer: Medicare HMO | Admitting: Psychiatry

## 2021-04-11 VITALS — BP 122/82 | HR 65 | Ht 65.0 in | Wt 165.0 lb

## 2021-04-11 DIAGNOSIS — G43119 Migraine with aura, intractable, without status migrainosus: Secondary | ICD-10-CM

## 2021-04-11 DIAGNOSIS — M5412 Radiculopathy, cervical region: Secondary | ICD-10-CM | POA: Diagnosis not present

## 2021-04-11 MED ORDER — METHYLPREDNISOLONE 4 MG PO TBPK: ORAL_TABLET | ORAL | 0 refills | Status: DC

## 2021-04-11 NOTE — Patient Instructions (Addendum)
Continue Aimovig for the next couple of months. Let me know if headaches persist after a couple of months on the Aimovig Limit over the counter use to 2 days per week to avoid rebound headaches Short course of steroids to help decrease headaches while decreasing tylenol use MRI of the neck Can take muscle relaxer (Robaxin) 3 times a day if needed

## 2021-04-25 DIAGNOSIS — G894 Chronic pain syndrome: Secondary | ICD-10-CM | POA: Diagnosis not present

## 2021-04-25 DIAGNOSIS — Z9889 Other specified postprocedural states: Secondary | ICD-10-CM | POA: Diagnosis not present

## 2021-04-25 DIAGNOSIS — M546 Pain in thoracic spine: Secondary | ICD-10-CM | POA: Diagnosis not present

## 2021-04-25 DIAGNOSIS — M5416 Radiculopathy, lumbar region: Secondary | ICD-10-CM | POA: Diagnosis not present

## 2021-05-02 ENCOUNTER — Other Ambulatory Visit: Payer: Self-pay | Admitting: Family Medicine

## 2021-05-02 DIAGNOSIS — K219 Gastro-esophageal reflux disease without esophagitis: Secondary | ICD-10-CM

## 2021-05-15 ENCOUNTER — Encounter: Payer: Self-pay | Admitting: Family Medicine

## 2021-05-15 ENCOUNTER — Ambulatory Visit (INDEPENDENT_AMBULATORY_CARE_PROVIDER_SITE_OTHER): Payer: Medicare HMO | Admitting: Family Medicine

## 2021-05-15 VITALS — BP 135/75 | HR 79 | Temp 98.1°F | Ht 65.0 in | Wt 170.2 lb

## 2021-05-15 DIAGNOSIS — R42 Dizziness and giddiness: Secondary | ICD-10-CM | POA: Diagnosis not present

## 2021-05-15 DIAGNOSIS — Z801 Family history of malignant neoplasm of trachea, bronchus and lung: Secondary | ICD-10-CM | POA: Diagnosis not present

## 2021-05-15 DIAGNOSIS — Z720 Tobacco use: Secondary | ICD-10-CM | POA: Diagnosis not present

## 2021-05-15 DIAGNOSIS — R0602 Shortness of breath: Secondary | ICD-10-CM

## 2021-05-15 DIAGNOSIS — E8941 Symptomatic postprocedural ovarian failure: Secondary | ICD-10-CM | POA: Diagnosis not present

## 2021-05-15 DIAGNOSIS — G43819 Other migraine, intractable, without status migrainosus: Secondary | ICD-10-CM

## 2021-05-15 DIAGNOSIS — F411 Generalized anxiety disorder: Secondary | ICD-10-CM | POA: Diagnosis not present

## 2021-05-15 DIAGNOSIS — F339 Major depressive disorder, recurrent, unspecified: Secondary | ICD-10-CM

## 2021-05-15 DIAGNOSIS — I10 Essential (primary) hypertension: Secondary | ICD-10-CM

## 2021-05-15 MED ORDER — FLUOXETINE HCL 20 MG PO CAPS
20.0000 mg | ORAL_CAPSULE | Freq: Every day | ORAL | 2 refills | Status: DC
Start: 2021-05-15 — End: 2021-06-22

## 2021-05-15 MED ORDER — BUSPIRONE HCL 15 MG PO TABS: 15.0000 mg | ORAL_TABLET | Freq: Two times a day (BID) | ORAL | 1 refills | Status: DC

## 2021-05-15 MED ORDER — LISINOPRIL 20 MG PO TABS: ORAL_TABLET | ORAL | 1 refills | Status: DC

## 2021-05-15 NOTE — Patient Instructions (Signed)
[employer] or [employer] for hot flashes.

## 2021-05-15 NOTE — Progress Notes (Signed)
Assessment & Plan:  1. Other migraine without status migrainosus, intractable Uncontrolled.  Patient to continue following with neurology.  2. Essential hypertension Well controlled on current regimen.  - lisinopril (ZESTRIL) 20 MG tablet; TAKE ONE (1) TABLET BY MOUTH EVERY DAY  Dispense: 90 tablet; Refill: 1  3. Generalized anxiety disorder Uncontrolled.  Started Prozac to help with anxiety, depression, and hot flashes. - busPIRone (BUSPAR) 15 MG tablet; Take 1 tablet (15 mg total) by mouth 2 (two) times daily.  Dispense: 180 tablet; Refill: 1 - FLUoxetine (PROZAC) 20 MG capsule; Take 1 capsule (20 mg total) by mouth daily.  Dispense: 30 capsule; Refill: 2  4. Depression, recurrent ([place]) Started Prozac to help with anxiety, depression, and hot flashes. - FLUoxetine (PROZAC) 20 MG capsule; Take 1 capsule (20 mg total) by mouth daily.  Dispense: 30 capsule; Refill: 2  5. Hot flashes due to surgical menopause Uncontrolled. Started Prozac to help with anxiety, depression, and hot flashes.  Also discussed with patient that she can try Amberen or black cohosh over-the-counter. - FLUoxetine (PROZAC) 20 MG capsule; Take 1 capsule (20 mg total) by mouth daily.  Dispense: 30 capsule; Refill: 2  6. Tobacco use - CT Chest Wo Contrast; Future  7. Shortness of breath - CT Chest Wo Contrast; Future  8. Family history of lung cancer - CT Chest Wo Contrast; Future  9. Feeling faint - CBC with Differential/Platelet - CMP14+EGFR - TSH   Follow up plan: Return in about 6 weeks (around 06/26/2021) for follow-up of chronic medication conditions.  [name], MSN, APRN, FNP-C [facility]   Subjective:   Patient ID: Susan Ward, Ward    DOB: 08-Jan-1966, 55 y.o.   MRN: [id number]  HPI: Susan Ward is a 55 y.o. Ward presenting on 05/15/2021 for Migraine, Anxiety (2 month follow up ), and feeling like she will pass out (X 4 days on and off  )  Anxiety/Depression: She states she is not doing well with her anxiety. She is no longer taking Cymbalta. Buspar was increased at her last visit. Patient patient reports she is having crying, anger, and aggravated spells when she feels like she shouldn't. She feels this is all related to her pain. She did recently had an x-ray of her thoracic spine, but has not yet heard about the results.   GAD 7 : Generalized Anxiety Score 05/15/2021 03/15/2021 02/01/2021 01/04/2021  Nervous, Anxious, on Edge 0 '3 1 2  ' Control/stop worrying '1 3 3 1  ' Worry too much - different things '1 3 3 2  ' Trouble relaxing '1 3 2 2  ' Restless 0 0 2 2  Easily annoyed or irritable 1 0 3 2  Afraid - awful might happen 1 0 0 0  Total GAD 7 Score '5 12 14 11  ' Anxiety Difficulty Not difficult at all Extremely difficult Somewhat difficult Somewhat difficult   Depression screen [facility] 2/9 05/15/2021 03/15/2021 02/01/2021  Decreased Interest 1 0 1  Down, Depressed, Hopeless 1 0 1  PHQ - 2 Score 2 0 2  Altered sleeping 0 0 0  Tired, decreased energy 0 3 3  Change in appetite 1 0 1  Feeling bad or failure about yourself  0 0 0  Trouble concentrating 1 0 2  Moving slowly or fidgety/restless 0 0 2  Suicidal thoughts 0 0 0  PHQ-9 Score '4 3 10  ' Difficult doing work/chores Not difficult at all Somewhat difficult Somewhat difficult  Some recent data might  be hidden   Migraines: She has had chronic headaches for 10-15 years that have never been managed effectively. She states that they start at the base of her skull on the right side and progress to her frontal lobe. She states she has aura and vision disturbances, specifically black shadows around objects. Negative brain MRI on 02/02/2021. She did get in to see neurology on 04/11/2021, who is now managing her migraines. Patient reports migraines are not any better after coarse of steroids given by neurology.    New complaints: Patient c/o upper back pain that worsens with deep breaths  and regular breathing. The pain has been present x4 months and is getting worse. Patient also endorses shortness of breath. She does smoke and has a family history of lung cancer. She states her father's lung cancer started with the same symptoms.   Patient is also concerned about hot flashes that have been going on "as long as she can remember". She had a hysterectomy at 55 years of age. She has not tried any OTC medications. She was previously on Cymbalta, but states she had the hot flashes then as well. She is currently taking gabapentin for pain which is also not helping the hot flashes.   Patient reports three different times she felt like she was going to pass out. She gets really weak that comes on very suddenly. Denies dizziness and heart rhythm irregularity. She does get nauseated when it happens. Blood pressure is normal when it occurs. Unable to check blood glucose.    ROS: Negative unless specifically indicated above in HPI.   Relevant past medical history reviewed and updated as indicated.   Allergies and medications reviewed and updated.   Current Outpatient Medications:    acetaminophen (TYLENOL) 500 MG tablet, Take 500 mg by mouth every 6 (six) hours as needed., Disp: , Rfl:    albuterol (ACCUNEB) 0.63 MG/3ML nebulizer solution, Take 3 mLs (0.63 mg total) by nebulization every 6 (six) hours as needed for wheezing., Disp: 75 mL, Rfl: 12   albuterol (VENTOLIN HFA) 108 (90 Base) MCG/ACT inhaler, USE 2 PUFFS EVERY 6 HOURS AS NEEDED, Disp: 8.5 g, Rfl: 3   Alpha-Lipoic Acid 200 MG CAPS, Take 200 mg by mouth daily., Disp: , Rfl:    atorvastatin (LIPITOR) 40 MG tablet, TAKE 1 TABLET DAILY AT 6PM, Disp: 90 tablet, Rfl: 1   busPIRone (BUSPAR) 15 MG tablet, Take 1 tablet (15 mg total) by mouth 2 (two) times daily., Disp: 60 tablet, Rfl: 2   cetirizine (ZYRTEC) 10 MG tablet, 1 tablet daily, Disp: 90 tablet, Rfl: 1   Erenumab-aooe (AIMOVIG) 70 MG/ML SOAJ, Inject 70 mg into the skin every 30  (thirty) days., Disp: 1 mL, Rfl: 2   FA-B6-B12-Omega 3-Phytosterols (BP VIT 3 PO), Take by mouth., Disp: , Rfl:    famotidine (PEPCID) 40 MG tablet, 1 tablet daily, Disp: 90 tablet, Rfl: 1   fluticasone (FLONASE) 50 MCG/ACT nasal spray, Place 1 spray into both nostrils 2 (two) times daily. (Patient taking differently: Place 1 spray into both nostrils at bedtime as needed (congestion.).), Disp: 16 g, Rfl: 11   gabapentin (NEURONTIN) 600 MG tablet, Take 1 tablet by mouth in the morning, at noon, and at bedtime., Disp: , Rfl:    HYDROcodone-acetaminophen (NORCO) 10-325 MG tablet, Take 1 tablet by mouth every 4 (four) hours as needed for moderate pain., Disp: 30 tablet, Rfl: 0   linaclotide (LINZESS) 145 MCG CAPS capsule, Take 1 capsule (145 mcg total) by  mouth daily before breakfast. (Patient taking differently: Take 145 mcg by mouth daily as needed (constipation).), Disp: 30 capsule, Rfl: 5   lisinopril (ZESTRIL) 20 MG tablet, TAKE ONE (1) TABLET BY MOUTH EVERY DAY, Disp: 30 tablet, Rfl: 1   methocarbamol (ROBAXIN) 500 MG tablet, Take 1 tablet (500 mg total) by mouth every 6 (six) hours as needed for muscle spasms., Disp: 90 tablet, Rfl: 2   methylPREDNISolone (MEDROL DOSEPAK) 4 MG TBPK tablet, Take as directed by packaging, Disp: 1 each, Rfl: 0   omeprazole (PRILOSEC) 40 MG capsule, TAKE ONE CAPSULE BY MOUTH DAILY, Disp: 90 capsule, Rfl: 0   OVER THE COUNTER MEDICATION, 200 mg. Alpha- lipoic acid, Disp: , Rfl:   Allergies  Allergen Reactions   Oxycodone Shortness Of Breath and Other (See Comments)    breathing issues    Penicillin G Rash and Other (See Comments)    Yeast Infection Did it involve swelling of the face/tongue/throat, SOB, or low BP? No Did it involve sudden or severe rash/hives, skin peeling, or any reaction on the inside of your mouth or nose? Yes Did you need to seek medical attention at a hospital or doctor's office? Yes When did it last happen? More than 20 years ago     Aspirin Nausea And Vomiting    upset stomach    Objective:   BP 135/75    Pulse 79    Temp 98.1 F (36.7 C) (Temporal)    Ht '5\' 5"'  (1.651 m)    Wt 170 lb 3.2 oz (77.2 kg)    SpO2 98%    BMI 28.32 kg/m    Physical Exam Vitals reviewed.  Constitutional:      General: She is not in acute distress.    Appearance: Normal appearance. She is overweight. She is not ill-appearing, toxic-appearing or diaphoretic.  HENT:     Head: Normocephalic and atraumatic.  Eyes:     General: No scleral icterus.       Right eye: No discharge.        Left eye: No discharge.     Conjunctiva/sclera: Conjunctivae normal.  Cardiovascular:     Rate and Rhythm: Normal rate and regular rhythm.     Heart sounds: Normal heart sounds. No murmur heard.   No friction rub. No gallop.  Pulmonary:     Effort: Pulmonary effort is normal. No respiratory distress.     Breath sounds: Normal breath sounds. No stridor. No wheezing, rhonchi or rales.  Musculoskeletal:        General: Normal range of motion.     Cervical back: Normal range of motion.  Skin:    General: Skin is warm and dry.     Capillary Refill: Capillary refill takes less than 2 seconds.  Neurological:     General: No focal deficit present.     Mental Status: She is alert and oriented to person, place, and time. Mental status is at baseline.  Psychiatric:        Mood and Affect: Mood normal.        Behavior: Behavior normal.        Thought Content: Thought content normal.        Judgment: Judgment normal.

## 2021-05-16 LAB — CBC WITH DIFFERENTIAL/PLATELET
Basophils Absolute: 0 10*3/uL (ref 0.0–0.2)
Basos: 0 %
EOS (ABSOLUTE): 0.2 10*3/uL (ref 0.0–0.4)
Eos: 2 %
Hematocrit: 47.4 % — ABNORMAL HIGH (ref 34.0–46.6)
Hemoglobin: 16.4 g/dL — ABNORMAL HIGH (ref 11.1–15.9)
Immature Grans (Abs): 0.1 10*3/uL (ref 0.0–0.1)
Immature Granulocytes: 1 %
Lymphocytes Absolute: 2.6 10*3/uL (ref 0.7–3.1)
Lymphs: 27 %
MCH: 31.3 pg (ref 26.6–33.0)
MCHC: 34.6 g/dL (ref 31.5–35.7)
MCV: 91 fL (ref 79–97)
Monocytes Absolute: 0.9 10*3/uL (ref 0.1–0.9)
Monocytes: 9 %
Neutrophils Absolute: 5.9 10*3/uL (ref 1.4–7.0)
Neutrophils: 61 %
Platelets: 250 10*3/uL (ref 150–450)
RBC: 5.24 x10E6/uL (ref 3.77–5.28)
RDW: 13.2 % (ref 11.7–15.4)
WBC: 9.6 10*3/uL (ref 3.4–10.8)

## 2021-05-16 LAB — CMP14+EGFR
ALT: 14 IU/L (ref 0–32)
AST: 13 IU/L (ref 0–40)
Albumin/Globulin Ratio: 1.7 (ref 1.2–2.2)
Albumin: 4.5 g/dL (ref 3.8–4.9)
Alkaline Phosphatase: 103 IU/L (ref 44–121)
BUN/Creatinine Ratio: 24 — ABNORMAL HIGH (ref 9–23)
BUN: 15 mg/dL (ref 6–24)
Bilirubin Total: <0.2 mg/dL (ref 0.0–1.2)
CO2: 23 mmol/L (ref 20–29)
Calcium: 10.3 mg/dL — ABNORMAL HIGH (ref 8.7–10.2)
Chloride: 104 mmol/L (ref 96–106)
Creatinine, Ser: 0.63 mg/dL (ref 0.57–1.00)
Globulin, Total: 2.6 g/dL (ref 1.5–4.5)
Glucose: 117 mg/dL — ABNORMAL HIGH (ref 70–99)
Potassium: 5.5 mmol/L — ABNORMAL HIGH (ref 3.5–5.2)
Sodium: 141 mmol/L (ref 134–144)
Total Protein: 7.1 g/dL (ref 6.0–8.5)
eGFR: 103 mL/min/{1.73_m2} (ref >59)

## 2021-05-16 LAB — TSH: TSH: 0.39 u[IU]/mL — ABNORMAL LOW (ref 0.450–4.500)

## 2021-05-22 ENCOUNTER — Telehealth: Payer: Self-pay | Admitting: Family Medicine

## 2021-05-22 DIAGNOSIS — R0602 Shortness of breath: Secondary | ICD-10-CM

## 2021-05-22 NOTE — Telephone Encounter (Signed)
Patient aware and verbalizes understanding. 

## 2021-05-22 NOTE — Telephone Encounter (Signed)
[employer] called last week - CT chest denied - needs in house CXR before they will cover.   I have placed an order for the chest x-ray. Please make patient aware that she can come have this done here in the office. She does not need an appointment.

## 2021-05-23 ENCOUNTER — Other Ambulatory Visit (INDEPENDENT_AMBULATORY_CARE_PROVIDER_SITE_OTHER): Payer: Medicare Other

## 2021-05-23 DIAGNOSIS — R0602 Shortness of breath: Secondary | ICD-10-CM

## 2021-05-28 NOTE — Telephone Encounter (Signed)
Susan Ward (Key: [id number]) Aimovig 70MG /ML auto-injectors PA started for 2023.  Eligibility could not be verified for this patient - patient not found. Please review patient information and re-submit.  NA and no VM for pt - need to find out if her ins plan changed for 2023?

## 2021-05-29 ENCOUNTER — Other Ambulatory Visit: Payer: Self-pay | Admitting: Family Medicine

## 2021-05-29 DIAGNOSIS — M542 Cervicalgia: Secondary | ICD-10-CM

## 2021-05-29 DIAGNOSIS — G43819 Other migraine, intractable, without status migrainosus: Secondary | ICD-10-CM

## 2021-06-05 ENCOUNTER — Ambulatory Visit (HOSPITAL_COMMUNITY): Payer: Medicare Other

## 2021-06-22 ENCOUNTER — Other Ambulatory Visit: Payer: Self-pay | Admitting: Family Medicine

## 2021-06-22 DIAGNOSIS — E8941 Symptomatic postprocedural ovarian failure: Secondary | ICD-10-CM

## 2021-06-22 DIAGNOSIS — F339 Major depressive disorder, recurrent, unspecified: Secondary | ICD-10-CM

## 2021-06-22 DIAGNOSIS — F411 Generalized anxiety disorder: Secondary | ICD-10-CM

## 2021-06-25 ENCOUNTER — Telehealth: Payer: Self-pay | Admitting: *Deleted

## 2021-06-25 NOTE — Telephone Encounter (Signed)
Message from Plan Request Reference Number: [id number]. AIMOVIG INJ 70MG /ML is approved through 05/19/2022. Your patient may now fill this prescription and it will be covered.  The drug store aware

## 2021-06-25 NOTE — Telephone Encounter (Signed)
Josph Macho (KeyCottie Banda) Rx #: [id number] Aimovig 70MG /ML auto-injectors  Wait for Determination Please wait for [employer] 2017 NCPDP to return a determination.

## 2021-06-27 ENCOUNTER — Encounter: Payer: Self-pay | Admitting: Family Medicine

## 2021-06-27 ENCOUNTER — Ambulatory Visit (INDEPENDENT_AMBULATORY_CARE_PROVIDER_SITE_OTHER): Payer: Medicare Other | Admitting: Family Medicine

## 2021-06-27 VITALS — BP 122/68 | HR 80 | Temp 98.0°F | Ht 65.0 in | Wt 171.6 lb

## 2021-06-27 DIAGNOSIS — E7800 Pure hypercholesterolemia, unspecified: Secondary | ICD-10-CM

## 2021-06-27 DIAGNOSIS — R0602 Shortness of breath: Secondary | ICD-10-CM | POA: Diagnosis not present

## 2021-06-27 DIAGNOSIS — F339 Major depressive disorder, recurrent, unspecified: Secondary | ICD-10-CM

## 2021-06-27 DIAGNOSIS — Z801 Family history of malignant neoplasm of trachea, bronchus and lung: Secondary | ICD-10-CM | POA: Diagnosis not present

## 2021-06-27 DIAGNOSIS — G43819 Other migraine, intractable, without status migrainosus: Secondary | ICD-10-CM

## 2021-06-27 DIAGNOSIS — R202 Paresthesia of skin: Secondary | ICD-10-CM | POA: Diagnosis not present

## 2021-06-27 DIAGNOSIS — R252 Cramp and spasm: Secondary | ICD-10-CM | POA: Diagnosis not present

## 2021-06-27 DIAGNOSIS — I10 Essential (primary) hypertension: Secondary | ICD-10-CM | POA: Diagnosis not present

## 2021-06-27 DIAGNOSIS — J302 Other seasonal allergic rhinitis: Secondary | ICD-10-CM

## 2021-06-27 DIAGNOSIS — K219 Gastro-esophageal reflux disease without esophagitis: Secondary | ICD-10-CM | POA: Diagnosis not present

## 2021-06-27 DIAGNOSIS — E8941 Symptomatic postprocedural ovarian failure: Secondary | ICD-10-CM

## 2021-06-27 DIAGNOSIS — F411 Generalized anxiety disorder: Secondary | ICD-10-CM

## 2021-06-27 DIAGNOSIS — Z720 Tobacco use: Secondary | ICD-10-CM | POA: Diagnosis not present

## 2021-06-27 DIAGNOSIS — K5903 Drug induced constipation: Secondary | ICD-10-CM | POA: Diagnosis not present

## 2021-06-27 DIAGNOSIS — J449 Chronic obstructive pulmonary disease, unspecified: Secondary | ICD-10-CM

## 2021-06-27 MED ORDER — CETIRIZINE HCL 10 MG PO TABS: ORAL_TABLET | ORAL | 1 refills | Status: DC

## 2021-06-27 MED ORDER — OXYBUTYNIN CHLORIDE ER 5 MG PO TB24: 5.0000 mg | ORAL_TABLET | Freq: Every day | ORAL | 2 refills | Status: DC

## 2021-06-27 MED ORDER — FLUOXETINE HCL 20 MG PO CAPS: 20.0000 mg | ORAL_CAPSULE | Freq: Every day | ORAL | 1 refills | Status: DC

## 2021-06-27 MED ORDER — GABAPENTIN 800 MG PO TABS: 800.0000 mg | ORAL_TABLET | Freq: Three times a day (TID) | ORAL | 1 refills | Status: DC

## 2021-06-27 MED ORDER — FAMOTIDINE 40 MG PO TABS: ORAL_TABLET | ORAL | 1 refills | Status: DC

## 2021-06-27 MED ORDER — OMEPRAZOLE 40 MG PO CPDR: 40.0000 mg | DELAYED_RELEASE_CAPSULE | Freq: Every day | ORAL | 1 refills | Status: DC

## 2021-06-27 MED ORDER — ATORVASTATIN CALCIUM 40 MG PO TABS: 40.0000 mg | ORAL_TABLET | Freq: Every day | ORAL | 1 refills | Status: DC

## 2021-06-27 NOTE — Progress Notes (Signed)
Assessment & Plan:  1. Generalized anxiety disorder Well controlled on current regimen.  - FLUoxetine (PROZAC) 20 MG capsule; Take 1 capsule (20 mg total) by mouth daily.  Dispense: 90 capsule; Refill: 1 - CMP14+EGFR  2. Depression, recurrent ([place]) Well controlled on current regimen.  - FLUoxetine (PROZAC) 20 MG capsule; Take 1 capsule (20 mg total) by mouth daily.  Dispense: 90 capsule; Refill: 1 - CMP14+EGFR  3. Hot flashes due to surgical menopause Uncontrolled. Trial of Ditropan.  - FLUoxetine (PROZAC) 20 MG capsule; Take 1 capsule (20 mg total) by mouth daily.  Dispense: 90 capsule; Refill: 1 - CMP14+EGFR - TSH - oxybutynin (DITROPAN-XL) 5 MG 24 hr tablet; Take 1 tablet (5 mg total) by mouth at bedtime.  Dispense: 30 tablet; Refill: 2  4-6. Shortness of breath/Tobacco use/Family history of lung cancer Our referral coordinator has been contacted to get the Chest CT rescheduled.   7. Tingling in extremities Well controlled on current regimen.  - gabapentin (NEURONTIN) 800 MG tablet; Take 1 tablet (800 mg total) by mouth in the morning, at noon, and at bedtime.  Dispense: 270 tablet; Refill: 1 - CMP14+EGFR  8. Essential hypertension Well controlled on current regimen.  - CMP14+EGFR - CBC with Differential/Platelet - Lipid panel  9. Pure hypercholesterolemia Well controlled on current regimen.  - atorvastatin (LIPITOR) 40 MG tablet; Take 1 tablet (40 mg total) by mouth daily.  Dispense: 90 tablet; Refill: 1 - CMP14+EGFR - Lipid panel  10. Gastroesophageal reflux disease without esophagitis Well controlled on current regimen.  - famotidine (PEPCID) 40 MG tablet; 1 tablet daily  Dispense: 90 tablet; Refill: 1 - omeprazole (PRILOSEC) 40 MG capsule; Take 1 capsule (40 mg total) by mouth daily.  Dispense: 90 capsule; Refill: 1 - CMP14+EGFR  11. Drug-induced constipation Well controlled on current regimen.  - CMP14+EGFR  12. Other migraine without status migrainosus,  intractable Uncontrolled. Patient to follow-up with neurology. Recommended 500 mg magnesium, 400 mg riboflavin (vitamin B-2), and 150 mg coenzyme Q10. The magnesium has to be taken for three months to get a benefit. Also, the herb butterbur can help prevent migraines at a dosage of 75 mg.  - CMP14+EGFR  13. COPD without exacerbation ([place]) Well controlled on current regimen.   14. Seasonal allergies Well controlled on current regimen.  - cetirizine (ZYRTEC) 10 MG tablet; 1 tablet daily  Dispense: 90 tablet; Refill: 1  15. Leg cramping - TSH - CBC with Differential/Platelet - Magnesium   Follow up plan: Return in about 6 weeks (around 08/08/2021) for Hot flashes.  [name], MSN, APRN, FNP-C [facility]  Subjective:   Patient ID: DARNETTA KESSELMAN, female    DOB: Apr 15, 1966, 56 y.o.   MRN: [id number]  HPI: LETESHA KLECKER is a 56 y.o. female presenting on 06/27/2021 for Medical Management of Chronic Issues (6 week chronic medical conditions ) and Menopause  Anxiety/Depression: Patient was started on Prozac 20 mg daily at our last visit. She is also taking BuSpar twice daily.   GAD 7 : Generalized Anxiety Score 06/27/2021 05/15/2021 03/15/2021 02/01/2021  Nervous, Anxious, on Edge 0 0 3 1  Control/stop worrying 0 '1 3 3  ' Worry too much - different things 0 '1 3 3  ' Trouble relaxing 0 '1 3 2  ' Restless 0 0 0 2  Easily annoyed or irritable 0 1 0 3  Afraid - awful might happen 0 1 0 0  Total GAD 7 Score 0 '5 12 14  ' Anxiety  Difficulty Not difficult at all Not difficult at all Extremely difficult Somewhat difficult   Depression screen [facility] 2/9 06/27/2021 05/15/2021 03/15/2021  Decreased Interest 0 1 0  Down, Depressed, Hopeless 0 1 0  PHQ - 2 Score 0 2 0  Altered sleeping 0 0 0  Tired, decreased energy 0 0 3  Change in appetite 0 1 0  Feeling bad or failure about yourself  0 0 0  Trouble concentrating 0 1 0  Moving slowly or fidgety/restless 0 0 0  Suicidal thoughts 0 0  0  PHQ-9 Score 0 4 3  Difficult doing work/chores Not difficult at all Not difficult at all Somewhat difficult  Some recent data might be hidden   Hot Flashes: reports her hot flashes and night sweats have been present since she was 56 years of age when she had her hysterectomy. She was started on Prozac at our last visit. She is also on gabapentin for pain relief. Also encouraged her to try black cohosh or Amberen OTC. States she has taken both black cohosh and Amberen which were not helpful. She keeps the thermostat at home on 58 degrees.  Tobacco use/Shortness of breath/Family history of lung cancer: CT Chest ordered previously which was denied by her insurance as they wanted a chest x-ray first. This was completed and the Chest CT was approved. She was unable to go when it was initially scheduled due to a stomach bug and would like to get this rescheduled.   Pain: She is followed for pain by [facility], and has been taking mediations as prescribed. Her surgeon placed her back on gabapentin and discontinued Lyrica due to hand tingling and lack of improvement in pain management. She will no longer be seeing him and would like me to take over the gabapentin. She will continue getting Norco from her pain management doctor.   Hypertension: taking lisinopril 10 mg daily. She is not checking her blood pressure at home.   Hyperlipidemia: taking atorvastatin daily.   GERD: taking famotidine and omeprazole daily.   Constipation: takes Linzess as needed.   Migraines: managed by neurology who she last saw on 04/11/2021. She is scheduled to see neurology again on 07/16/2021. She reports the Aimovig is not helping at all.   COPD with allergies: uses albuterol as needed during allergy season. She has been on Zyrtec for 10 years. She is an active smoker and is not ready to quit.  New Complaints: Patient reports muscle cramps in her legs.    ROS: Negative unless specifically  indicated above in HPI.   Relevant past medical history reviewed and updated as indicated.   Allergies and medications reviewed and updated.   Current Outpatient Medications:    acetaminophen (TYLENOL) 500 MG tablet, Take 500 mg by mouth every 6 (six) hours as needed., Disp: , Rfl:    albuterol (ACCUNEB) 0.63 MG/3ML nebulizer solution, Take 3 mLs (0.63 mg total) by nebulization every 6 (six) hours as needed for wheezing., Disp: 75 mL, Rfl: 12   albuterol (VENTOLIN HFA) 108 (90 Base) MCG/ACT inhaler, USE 2 PUFFS EVERY 6 HOURS AS NEEDED, Disp: 8.5 g, Rfl: 3   Alpha-Lipoic Acid 200 MG CAPS, Take 200 mg by mouth daily., Disp: , Rfl:    atorvastatin (LIPITOR) 40 MG tablet, TAKE 1 TABLET DAILY AT 6PM, Disp: 90 tablet, Rfl: 1   busPIRone (BUSPAR) 15 MG tablet, Take 1 tablet (15 mg total) by mouth 2 (two) times daily., Disp: 180 tablet, Rfl: 1  cetirizine (ZYRTEC) 10 MG tablet, 1 tablet daily, Disp: 90 tablet, Rfl: 1   Erenumab-aooe (AIMOVIG) 70 MG/ML SOAJ, INJECT 70MG INTO THE SKIN EVERY 30 DAYS, Disp: 1 mL, Rfl: 1   FA-B6-B12-Omega 3-Phytosterols (BP VIT 3 PO), Take by mouth., Disp: , Rfl:    famotidine (PEPCID) 40 MG tablet, 1 tablet daily, Disp: 90 tablet, Rfl: 1   FLUoxetine (PROZAC) 20 MG capsule, TAKE ONE CAPSULE BY MOUTH DAILY, Disp: 30 capsule, Rfl: 5   fluticasone (FLONASE) 50 MCG/ACT nasal spray, Place 1 spray into both nostrils 2 (two) times daily. (Patient taking differently: Place 1 spray into both nostrils at bedtime as needed (congestion.).), Disp: 16 g, Rfl: 11   gabapentin (NEURONTIN) 600 MG tablet, Take 1 tablet by mouth in the morning, at noon, and at bedtime., Disp: , Rfl:    HYDROcodone-acetaminophen (NORCO) 10-325 MG tablet, Take 1 tablet by mouth every 4 (four) hours as needed for moderate pain., Disp: 30 tablet, Rfl: 0   linaclotide (LINZESS) 145 MCG CAPS capsule, Take 1 capsule (145 mcg total) by mouth daily before breakfast. (Patient taking differently: Take 145 mcg by mouth  daily as needed (constipation).), Disp: 30 capsule, Rfl: 5   lisinopril (ZESTRIL) 20 MG tablet, TAKE ONE (1) TABLET BY MOUTH EVERY DAY, Disp: 90 tablet, Rfl: 1   methocarbamol (ROBAXIN) 500 MG tablet, TAKE 1 TABLET BY MOUTH EVERY 6 HOURS AS NEEDED FOR MUSCLE SPAMS, Disp: 90 tablet, Rfl: 2   methylPREDNISolone (MEDROL DOSEPAK) 4 MG TBPK tablet, Take as directed by packaging, Disp: 1 each, Rfl: 0   omeprazole (PRILOSEC) 40 MG capsule, TAKE ONE CAPSULE BY MOUTH DAILY, Disp: 90 capsule, Rfl: 0   OVER THE COUNTER MEDICATION, 200 mg. Alpha- lipoic acid, Disp: , Rfl:   Allergies  Allergen Reactions   Oxycodone Shortness Of Breath and Other (See Comments)    breathing issues    Penicillin G Rash and Other (See Comments)    Yeast Infection Did it involve swelling of the face/tongue/throat, SOB, or low BP? No Did it involve sudden or severe rash/hives, skin peeling, or any reaction on the inside of your mouth or nose? Yes Did you need to seek medical attention at a hospital or doctor's office? Yes When did it last happen? More than 20 years ago    Aspirin Nausea And Vomiting    upset stomach    Objective:   BP 140/73    Pulse 80    Temp 98 F (36.7 C) (Temporal)    Ht '5\' 5"'  (1.651 m)    Wt 171 lb 9.6 oz (77.8 kg)    SpO2 98%    BMI 28.56 kg/m    Physical Exam Vitals reviewed.  Constitutional:      General: She is not in acute distress.    Appearance: Normal appearance. She is overweight. She is not ill-appearing, toxic-appearing or diaphoretic.  HENT:     Head: Normocephalic and atraumatic.  Eyes:     General: No scleral icterus.       Right eye: No discharge.        Left eye: No discharge.     Conjunctiva/sclera: Conjunctivae normal.  Cardiovascular:     Rate and Rhythm: Normal rate and regular rhythm.     Heart sounds: Normal heart sounds. No murmur heard.   No friction rub. No gallop.  Pulmonary:     Effort: Pulmonary effort is normal. No respiratory distress.     Breath  sounds: Normal breath sounds. No  stridor. No wheezing, rhonchi or rales.  Musculoskeletal:        General: Normal range of motion.     Cervical back: Normal range of motion.  Skin:    General: Skin is warm and dry.     Capillary Refill: Capillary refill takes less than 2 seconds.  Neurological:     General: No focal deficit present.     Mental Status: She is alert and oriented to person, place, and time. Mental status is at baseline.  Psychiatric:        Mood and Affect: Mood normal.        Behavior: Behavior normal.        Thought Content: Thought content normal.        Judgment: Judgment normal.

## 2021-06-27 NOTE — Patient Instructions (Signed)
Migraines: 500 mg magnesium, 400 mg riboflavin (vitamin B-2), and 150 mg coenzyme Q10. The magnesium has to be taken for three months to get a benefit. Also, the herb butterbur can help prevent migraines at a dosage of 75 mg.

## 2021-06-28 LAB — CBC WITH DIFFERENTIAL/PLATELET
Basophils Absolute: 0 10*3/uL (ref 0.0–0.2)
Basos: 0 %
EOS (ABSOLUTE): 0.3 10*3/uL (ref 0.0–0.4)
Eos: 3 %
Hematocrit: 48.4 % — ABNORMAL HIGH (ref 34.0–46.6)
Hemoglobin: 16.2 g/dL — ABNORMAL HIGH (ref 11.1–15.9)
Immature Grans (Abs): 0 10*3/uL (ref 0.0–0.1)
Immature Granulocytes: 0 %
Lymphocytes Absolute: 3.2 10*3/uL — ABNORMAL HIGH (ref 0.7–3.1)
Lymphs: 34 %
MCH: 30.6 pg (ref 26.6–33.0)
MCHC: 33.5 g/dL (ref 31.5–35.7)
MCV: 92 fL (ref 79–97)
Monocytes Absolute: 1 10*3/uL — ABNORMAL HIGH (ref 0.1–0.9)
Monocytes: 11 %
Neutrophils Absolute: 4.7 10*3/uL (ref 1.4–7.0)
Neutrophils: 52 %
Platelets: 239 10*3/uL (ref 150–450)
RBC: 5.29 x10E6/uL — ABNORMAL HIGH (ref 3.77–5.28)
RDW: 13 % (ref 11.7–15.4)
WBC: 9.2 10*3/uL (ref 3.4–10.8)

## 2021-06-28 LAB — CMP14+EGFR
ALT: 21 IU/L (ref 0–32)
AST: 44 IU/L — ABNORMAL HIGH (ref 0–40)
Albumin/Globulin Ratio: 1.7 (ref 1.2–2.2)
Albumin: 4.5 g/dL (ref 3.8–4.9)
Alkaline Phosphatase: 106 IU/L (ref 44–121)
BUN/Creatinine Ratio: 14 (ref 9–23)
BUN: 10 mg/dL (ref 6–24)
Bilirubin Total: <0.2 mg/dL (ref 0.0–1.2)
CO2: 22 mmol/L (ref 20–29)
Calcium: 9.4 mg/dL (ref 8.7–10.2)
Chloride: 105 mmol/L (ref 96–106)
Creatinine, Ser: 0.69 mg/dL (ref 0.57–1.00)
Globulin, Total: 2.7 g/dL (ref 1.5–4.5)
Glucose: 107 mg/dL — ABNORMAL HIGH (ref 70–99)
Potassium: 4.8 mmol/L (ref 3.5–5.2)
Sodium: 142 mmol/L (ref 134–144)
Total Protein: 7.2 g/dL (ref 6.0–8.5)
eGFR: 101 mL/min/{1.73_m2} (ref >59)

## 2021-06-28 LAB — LIPID PANEL
Chol/HDL Ratio: 4.6 ratio — ABNORMAL HIGH (ref 0.0–4.4)
Cholesterol, Total: 164 mg/dL (ref 100–199)
HDL: 36 mg/dL — ABNORMAL LOW (ref >39)
LDL Chol Calc (NIH): 94 mg/dL (ref 0–99)
Triglycerides: 195 mg/dL — ABNORMAL HIGH (ref 0–149)
VLDL Cholesterol Cal: 34 mg/dL (ref 5–40)

## 2021-06-28 LAB — MAGNESIUM: Magnesium: 1.8 mg/dL (ref 1.6–2.3)

## 2021-06-28 LAB — TSH: TSH: 0.901 u[IU]/mL (ref 0.450–4.500)

## 2021-07-02 ENCOUNTER — Encounter: Payer: Self-pay | Admitting: Family Medicine

## 2021-07-02 DIAGNOSIS — J302 Other seasonal allergic rhinitis: Secondary | ICD-10-CM | POA: Insufficient documentation

## 2021-07-10 ENCOUNTER — Encounter: Payer: Self-pay | Admitting: Family Medicine

## 2021-07-10 ENCOUNTER — Ambulatory Visit (INDEPENDENT_AMBULATORY_CARE_PROVIDER_SITE_OTHER): Payer: Medicare Other | Admitting: Family Medicine

## 2021-07-10 VITALS — BP 124/72 | HR 69 | Temp 97.4°F | Ht 65.0 in | Wt 174.2 lb

## 2021-07-10 DIAGNOSIS — J441 Chronic obstructive pulmonary disease with (acute) exacerbation: Secondary | ICD-10-CM | POA: Diagnosis not present

## 2021-07-10 MED ORDER — AZITHROMYCIN 250 MG PO TABS: ORAL_TABLET | ORAL | 0 refills | Status: DC

## 2021-07-10 MED ORDER — PSEUDOEPH-BROMPHEN-DM 30-2-10 MG/5ML PO SYRP
5.0000 mL | ORAL_SOLUTION | Freq: Four times a day (QID) | ORAL | 0 refills | Status: DC | PRN
Start: 2021-07-10 — End: 2021-07-18

## 2021-07-10 MED ORDER — METHYLPREDNISOLONE 4 MG PO TBPK: ORAL_TABLET | ORAL | 0 refills | Status: DC

## 2021-07-10 NOTE — Progress Notes (Signed)
Assessment & Plan:  1. COPD exacerbation [facility]) Education provided on COPD exacerbations.  Discussed symptom management. - azithromycin (ZITHROMAX Z-PAK) 250 MG tablet; Take 2 tablets (500 mg) PO today, then 1 tablet (250 mg) PO daily x4 days.  Dispense: 6 tablet; Refill: 0 - methylPREDNISolone (MEDROL DOSEPAK) 4 MG TBPK tablet; Use as directed.  Dispense: 21 each; Refill: 0 - brompheniramine-pseudoephedrine-DM 30-2-10 MG/5ML syrup; Take 5 mLs by mouth 4 (four) times daily as needed.  Dispense: 120 mL; Refill: 0   Follow up plan: Return if symptoms worsen or fail to improve.  [name], MSN, APRN, FNP-C [facility]  Subjective:   Patient ID: Susan Ward, female    DOB: 22-Sep-1965, 56 y.o.   MRN: [id number]  HPI: Susan Ward is a 56 y.o. female presenting on 07/10/2021 for cough, Ear Pain (Bilateral " ear popping"/), chest congestion , and Fever (Only Sunday- 103.6//X 4 days for symptoms )  Patient complains of a productive cough, head/chest congestion, headache, runny nose, sore throat, ear pain/pressure, fever, shortness of breath, and wheezing. Sputum is darker and thicker than normal. Onset of symptoms was 4 days ago, gradually worsening since that time. She is drinking plenty of fluids. Evaluation to date: none. Treatment to date:  Albuterol inhaler . She has a history of COPD. She does smoke.    ROS: Negative unless specifically indicated above in HPI.   Relevant past medical history reviewed and updated as indicated.   Allergies and medications reviewed and updated.   Current Outpatient Medications:    acetaminophen (TYLENOL) 500 MG tablet, Take 500 mg by mouth every 6 (six) hours as needed., Disp: , Rfl:    albuterol (ACCUNEB) 0.63 MG/3ML nebulizer solution, Take 3 mLs (0.63 mg total) by nebulization every 6 (six) hours as needed for wheezing., Disp: 75 mL, Rfl: 12   albuterol (VENTOLIN HFA) 108 (90 Base) MCG/ACT inhaler, USE 2 PUFFS EVERY 6 HOURS  AS NEEDED, Disp: 8.5 g, Rfl: 3   Alpha-Lipoic Acid 200 MG CAPS, Take 200 mg by mouth daily., Disp: , Rfl:    atorvastatin (LIPITOR) 40 MG tablet, Take 1 tablet (40 mg total) by mouth daily., Disp: 90 tablet, Rfl: 1   busPIRone (BUSPAR) 15 MG tablet, Take 1 tablet (15 mg total) by mouth 2 (two) times daily., Disp: 180 tablet, Rfl: 1   cetirizine (ZYRTEC) 10 MG tablet, 1 tablet daily, Disp: 90 tablet, Rfl: 1   Erenumab-aooe (AIMOVIG) 70 MG/ML SOAJ, INJECT 70MG  INTO THE SKIN EVERY 30 DAYS, Disp: 1 mL, Rfl: 1   FA-B6-B12-Omega 3-Phytosterols (BP VIT 3 PO), Take by mouth., Disp: , Rfl:    famotidine (PEPCID) 40 MG tablet, 1 tablet daily, Disp: 90 tablet, Rfl: 1   FLUoxetine (PROZAC) 20 MG capsule, Take 1 capsule (20 mg total) by mouth daily., Disp: 90 capsule, Rfl: 1   fluticasone (FLONASE) 50 MCG/ACT nasal spray, Place 1 spray into both nostrils 2 (two) times daily. (Patient taking differently: Place 1 spray into both nostrils at bedtime as needed (congestion.).), Disp: 16 g, Rfl: 11   gabapentin (NEURONTIN) 800 MG tablet, Take 1 tablet (800 mg total) by mouth in the morning, at noon, and at bedtime., Disp: 270 tablet, Rfl: 1   HYDROcodone-acetaminophen (NORCO) 10-325 MG tablet, Take 1 tablet by mouth every 4 (four) hours as needed for moderate pain., Disp: 30 tablet, Rfl: 0   linaclotide (LINZESS) 145 MCG CAPS capsule, Take 1 capsule (145 mcg total) by mouth daily before breakfast. (  Patient taking differently: Take 145 mcg by mouth daily as needed (constipation).), Disp: 30 capsule, Rfl: 5   lisinopril (ZESTRIL) 20 MG tablet, TAKE ONE (1) TABLET BY MOUTH EVERY DAY, Disp: 90 tablet, Rfl: 1   methocarbamol (ROBAXIN) 500 MG tablet, TAKE 1 TABLET BY MOUTH EVERY 6 HOURS AS NEEDED FOR MUSCLE SPAMS, Disp: 90 tablet, Rfl: 2   omeprazole (PRILOSEC) 40 MG capsule, Take 1 capsule (40 mg total) by mouth daily., Disp: 90 capsule, Rfl: 1   oxybutynin (DITROPAN-XL) 5 MG 24 hr tablet, Take 1 tablet (5 mg total) by  mouth at bedtime., Disp: 30 tablet, Rfl: 2  Allergies  Allergen Reactions   Oxycodone Shortness Of Breath and Other (See Comments)    breathing issues    Penicillin G Rash and Other (See Comments)    Yeast Infection Did it involve swelling of the face/tongue/throat, SOB, or low BP? No Did it involve sudden or severe rash/hives, skin peeling, or any reaction on the inside of your mouth or nose? Yes Did you need to seek medical attention at a hospital or doctor's office? Yes When did it last happen? More than 20 years ago    Aspirin Nausea And Vomiting    upset stomach    Objective:   BP 124/72    Pulse 69    Temp (!) 97.4 F (36.3 C) (Temporal)    Ht 5\' 5"  (1.651 m)    Wt 174 lb 3.2 oz (79 kg)    SpO2 94%    BMI 28.99 kg/m    Physical Exam Vitals reviewed.  Constitutional:      General: She is not in acute distress.    Appearance: Normal appearance. She is not ill-appearing, toxic-appearing or diaphoretic.  HENT:     Head: Normocephalic and atraumatic.     Right Ear: Tympanic membrane, ear canal and external ear normal. There is no impacted cerumen.     Left Ear: Ear canal and external ear normal. There is no impacted cerumen. Tympanic membrane is erythematous.     Nose: Nose normal. No congestion or rhinorrhea.     Mouth/Throat:     Mouth: Mucous membranes are moist.     Pharynx: Oropharynx is clear. No oropharyngeal exudate or posterior oropharyngeal erythema.  Eyes:     General: No scleral icterus.       Right eye: No discharge.        Left eye: No discharge.     Conjunctiva/sclera: Conjunctivae normal.  Cardiovascular:     Rate and Rhythm: Normal rate and regular rhythm.     Heart sounds: Normal heart sounds. No murmur heard.   No friction rub. No gallop.  Pulmonary:     Effort: Pulmonary effort is normal. No respiratory distress.     Breath sounds: Normal breath sounds. No stridor. No wheezing, rhonchi or rales.  Musculoskeletal:        General: Normal range of  motion.     Cervical back: Normal range of motion.  Lymphadenopathy:     Cervical: No cervical adenopathy.  Skin:    General: Skin is warm and dry.     Capillary Refill: Capillary refill takes less than 2 seconds.  Neurological:     General: No focal deficit present.     Mental Status: She is alert and oriented to person, place, and time. Mental status is at baseline.  Psychiatric:        Mood and Affect: Mood normal.  Behavior: Behavior normal.        Thought Content: Thought content normal.        Judgment: Judgment normal.

## 2021-07-12 DIAGNOSIS — Z9889 Other specified postprocedural states: Secondary | ICD-10-CM | POA: Diagnosis not present

## 2021-07-12 DIAGNOSIS — M5416 Radiculopathy, lumbar region: Secondary | ICD-10-CM | POA: Diagnosis not present

## 2021-07-12 DIAGNOSIS — M546 Pain in thoracic spine: Secondary | ICD-10-CM | POA: Diagnosis not present

## 2021-07-16 ENCOUNTER — Ambulatory Visit: Payer: Medicare Other | Admitting: Psychiatry

## 2021-07-16 ENCOUNTER — Encounter: Payer: Self-pay | Admitting: Psychiatry

## 2021-07-16 VITALS — BP 137/70 | HR 68 | Ht 65.0 in | Wt 174.0 lb

## 2021-07-16 DIAGNOSIS — M5412 Radiculopathy, cervical region: Secondary | ICD-10-CM | POA: Diagnosis not present

## 2021-07-16 DIAGNOSIS — M5481 Occipital neuralgia: Secondary | ICD-10-CM | POA: Diagnosis not present

## 2021-07-16 DIAGNOSIS — G43019 Migraine without aura, intractable, without status migrainosus: Secondary | ICD-10-CM

## 2021-07-16 MED ORDER — GABAPENTIN 100 MG PO CAPS: 100.0000 mg | ORAL_CAPSULE | Freq: Three times a day (TID) | ORAL | 3 refills | Status: DC

## 2021-07-16 MED ORDER — QULIPTA 60 MG PO TABS: 60.0000 mg | ORAL_TABLET | Freq: Every day | ORAL | 3 refills | Status: DC

## 2021-07-16 NOTE — Progress Notes (Signed)
° °  CC:  headaches  Follow-up Visit  Last visit: 04/11/21  Brief HPI: 56 year old female with a history of HTN, COPD, lumbar stenosis, HLD, smoking who follows in clinic for chronic migraines.  At her last visit she was noted to be overusing Tylenol and counseled to limit OTC use. Aimovig was continued as she had only had one dose at her last visit. MRI C-spine was ordered.  Interval History: Since her last visit her headaches have been the same. She stopped Aimovig in January as she did not notice a difference. Continues to have constant occipital headaches radiating forward. She had a severe migraine this month on top of her daily headaches. Took Excedrin, tylenol, and motrin together which took the edge off but did not resolve the pain. She has not been able to find a rescue medication which works for her.  MRI C-spine was not completed. States she never got a call to schedule this.  Headache days per month: 30 headache days, 2 migraine days Headache free days per month: 0  Current Headache Regimen: Preventative: gabapentin 600 mg TID Abortive: Tylenol, Excedrin  Prior Therapies                                  Lisinopril Cymbalta Topamax 100 mg BID Amitriptyline Gabapentin 600 mg TID Aimovig Robaxin 500 mg PRN Imitrex Maxalt Amerge Nurtec diclofenac  Physical Exam:   Vital Signs: BP 137/70    Pulse 68    Ht 5\' 5"  (1.651 m)    Wt 174 lb (78.9 kg)    BMI 28.96 kg/m  GENERAL:  well appearing, in no acute distress, alert  SKIN:  Color, texture, turgor normal. No rashes or lesions HEAD:  Normocephalic/atraumatic. RESP: normal respiratory effort MSK: +tenderness to palpation over bilateral occiput  NEUROLOGICAL: Mental Status: Alert, oriented to person, place and time, Follows commands, and Speech fluent and appropriate. Cranial Nerves: PERRL, face symmetric, no dysarthria, hearing grossly intact Motor: moves all extremities equally Gait:  normal-based.  IMPRESSION: 56 year old female with a history of HTN, COPD, lumbar stenosis, HLD, smoking who presents for follow up of chronic daily headaches. Suspect her headaches are a combination of occipital neuralgia and episodic migraines. Discussed medication options and she would prefer to avoid Botox or occipital nerve block due to previous bad experiences with injections. Will increase gabapentin to 900 mg TID to help with occipital neuralgia. For her migraines will start Qulipta 60 mg daily. MRI C-spine ordered to assess for cervical stenosis.  PLAN: -MRI C-spine -Prevention: Increase gabapentin to 900 mg TID. Start Qulipta 60 mg daily -next steps: consider Roselyn Meier, reyvow for rescue  Follow-up: 4 months  I spent a total of 30 minutes on the date of the service. Headache education was done. Discussed treatment options including preventive and acute medications, and physical therapy.  Discussed medication side effects, adverse reactions and drug interactions. Written educational materials and patient instructions outlining all of the above were given.  Genia Harold, MD 07/16/21 3:57 PM

## 2021-07-16 NOTE — Patient Instructions (Addendum)
Increase gabapentin to 900 mg (one 800 mg pill and one 100 mg pill) three times a day Start qulipta 60 mg daily for migraine prevention Can consider Cefaly device (similar to TENS unit for migraines)

## 2021-07-17 ENCOUNTER — Telehealth: Payer: Self-pay | Admitting: Psychiatry

## 2021-07-17 NOTE — Telephone Encounter (Signed)
[employer] medicare order sent to [facility], [facility] they will reach out to the patient.

## 2021-07-18 ENCOUNTER — Ambulatory Visit (INDEPENDENT_AMBULATORY_CARE_PROVIDER_SITE_OTHER): Payer: Medicare Other

## 2021-07-18 VITALS — Ht 65.0 in | Wt 174.0 lb

## 2021-07-18 DIAGNOSIS — Z0000 Encounter for general adult medical examination without abnormal findings: Secondary | ICD-10-CM | POA: Diagnosis not present

## 2021-07-18 NOTE — Patient Instructions (Signed)
Susan Ward , Thank you for taking time to come for your Medicare Wellness Visit. I appreciate your ongoing commitment to your health goals. Please review the following plan we discussed and let me know if I can assist you in the future.   Screening recommendations/referrals: Colonoscopy: Declined at this time. Mammogram: Done 11/10/2017 Repeat annually  Bone Density: Due at age 56.  Recommended yearly ophthalmology/optometry visit for glaucoma screening and checkup Recommended yearly dental visit for hygiene and checkup  Vaccinations: Influenza vaccine: Done 02/01/2021 Repeat annually  Pneumococcal vaccine: Done 03/05/2016 and 02/01/2021 Tdap vaccine: Done 01/23/2016 Repeat in 10 years  Shingles vaccine: Discussed.  Covid-19: Done 08/26/2019, 09/17/2019 and 05/11/2020.  Advanced directives: Advance directive discussed with you today. Even though you declined this today, please call our office should you change your mind, and we can give you the proper paperwork for you to fill out.   Conditions/risks identified: Aim for 30 minutes of exercise or brisk walking, 6-8 glasses of water, and 5 servings of fruits and vegetables each day. KEEP UP THE GOOD WORK!!  Next appointment: Follow up in one year for your annual wellness visit. 2024.  Preventive Care 40-64 Years, Female Preventive care refers to lifestyle choices and visits with your health care provider that can promote health and wellness. What does preventive care include? A yearly physical exam. This is also called an annual well check. Dental exams once or twice a year. Routine eye exams. Ask your health care provider how often you should have your eyes checked. Personal lifestyle choices, including: Daily care of your teeth and gums. Regular physical activity. Eating a healthy diet. Avoiding tobacco and drug use. Limiting alcohol use. Practicing safe sex. Taking low-dose aspirin daily starting at age 14. Taking vitamin and  mineral supplements as recommended by your health care provider. What happens during an annual well check? The services and screenings done by your health care provider during your annual well check will depend on your age, overall health, lifestyle risk factors, and family history of disease. Counseling  Your health care provider may ask you questions about your: Alcohol use. Tobacco use. Drug use. Emotional well-being. Home and relationship well-being. Sexual activity. Eating habits. Work and work Statistician. Method of birth control. Menstrual cycle. Pregnancy history. Screening  You may have the following tests or measurements: Height, weight, and BMI. Blood pressure. Lipid and cholesterol levels. These may be checked every 5 years, or more frequently if you are over 17 years old. Skin check. Lung cancer screening. You may have this screening every year starting at age 90 if you have a 30-pack-year history of smoking and currently smoke or have quit within the past 15 years. Fecal occult blood test (FOBT) of the stool. You may have this test every year starting at age 28. Flexible sigmoidoscopy or colonoscopy. You may have a sigmoidoscopy every 5 years or a colonoscopy every 10 years starting at age 61. Hepatitis C blood test. Hepatitis B blood test. Sexually transmitted disease (STD) testing. Diabetes screening. This is done by checking your blood sugar (glucose) after you have not eaten for a while (fasting). You may have this done every 1-3 years. Mammogram. This may be done every 1-2 years. Talk to your health care provider about when you should start having regular mammograms. This may depend on whether you have a family history of breast cancer. BRCA-related cancer screening. This may be done if you have a family history of breast, ovarian, tubal, or peritoneal cancers. Pelvic  exam and Pap test. This may be done every 3 years starting at age 89. Starting at age 50, this may  be done every 5 years if you have a Pap test in combination with an HPV test. Bone density scan. This is done to screen for osteoporosis. You may have this scan if you are at high risk for osteoporosis. Discuss your test results, treatment options, and if necessary, the need for more tests with your health care provider. Vaccines  Your health care provider may recommend certain vaccines, such as: Influenza vaccine. This is recommended every year. Tetanus, diphtheria, and acellular pertussis (Tdap, Td) vaccine. You may need a Td booster every 10 years. Zoster vaccine. You may need this after age 22. Pneumococcal 13-valent conjugate (PCV13) vaccine. You may need this if you have certain conditions and were not previously vaccinated. Pneumococcal polysaccharide (PPSV23) vaccine. You may need one or two doses if you smoke cigarettes or if you have certain conditions. Talk to your health care provider about which screenings and vaccines you need and how often you need them. This information is not intended to replace advice given to you by your health care provider. Make sure you discuss any questions you have with your health care provider. Document Released: 06/02/2015 Document Revised: 01/24/2016 Document Reviewed: 03/07/2015 Elsevier Interactive Patient Education  2017 [place] Prevention in the Home Falls can cause injuries. They can happen to people of all ages. There are many things you can do to make your home safe and to help prevent falls. What can I do on the outside of my home? Regularly fix the edges of walkways and driveways and fix any cracks. Remove anything that might make you trip as you walk through a door, such as a raised step or threshold. Trim any bushes or trees on the path to your home. Use bright outdoor lighting. Clear any walking paths of anything that might make someone trip, such as rocks or tools. Regularly check to see if handrails are loose or  broken. Make sure that both sides of any steps have handrails. Any raised decks and porches should have guardrails on the edges. Have any leaves, snow, or ice cleared regularly. Use sand or salt on walking paths during winter. Clean up any spills in your garage right away. This includes oil or grease spills. What can I do in the bathroom? Use night lights. Install grab bars by the toilet and in the tub and shower. Do not use towel bars as grab bars. Use non-skid mats or decals in the tub or shower. If you need to sit down in the shower, use a plastic, non-slip stool. Keep the floor dry. Clean up any water that spills on the floor as soon as it happens. Remove soap buildup in the tub or shower regularly. Attach bath mats securely with double-sided non-slip rug tape. Do not have throw rugs and other things on the floor that can make you trip. What can I do in the bedroom? Use night lights. Make sure that you have a light by your bed that is easy to reach. Do not use any sheets or blankets that are too big for your bed. They should not hang down onto the floor. Have a firm chair that has side arms. You can use this for support while you get dressed. Do not have throw rugs and other things on the floor that can make you trip. What can I do in the kitchen? Clean  up any spills right away. Avoid walking on wet floors. Keep items that you use a lot in easy-to-reach places. If you need to reach something above you, use a strong step stool that has a grab bar. Keep electrical cords out of the way. Do not use floor polish or wax that makes floors slippery. If you must use wax, use non-skid floor wax. Do not have throw rugs and other things on the floor that can make you trip. What can I do with my stairs? Do not leave any items on the stairs. Make sure that there are handrails on both sides of the stairs and use them. Fix handrails that are broken or loose. Make sure that handrails are as long as  the stairways. Check any carpeting to make sure that it is firmly attached to the stairs. Fix any carpet that is loose or worn. Avoid having throw rugs at the top or bottom of the stairs. If you do have throw rugs, attach them to the floor with carpet tape. Make sure that you have a light switch at the top of the stairs and the bottom of the stairs. If you do not have them, ask someone to add them for you. What else can I do to help prevent falls? Wear shoes that: Do not have high heels. Have rubber bottoms. Are comfortable and fit you well. Are closed at the toe. Do not wear sandals. If you use a stepladder: Make sure that it is fully opened. Do not climb a closed stepladder. Make sure that both sides of the stepladder are locked into place. Ask someone to hold it for you, if possible. Clearly mark and make sure that you can see: Any grab bars or handrails. First and last steps. Where the edge of each step is. Use tools that help you move around (mobility aids) if they are needed. These include: Canes. Walkers. Scooters. Crutches. Turn on the lights when you go into a dark area. Replace any light bulbs as soon as they burn out. Set up your furniture so you have a clear path. Avoid moving your furniture around. If any of your floors are uneven, fix them. If there are any pets around you, be aware of where they are. Review your medicines with your doctor. Some medicines can make you feel dizzy. This can increase your chance of falling. Ask your doctor what other things that you can do to help prevent falls. This information is not intended to replace advice given to you by your health care provider. Make sure you discuss any questions you have with your health care provider. Document Released: 03/02/2009 Document Revised: 10/12/2015 Document Reviewed: 06/10/2014 Elsevier Interactive Patient Education  2017 [employer].
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
## 2021-07-23 ENCOUNTER — Other Ambulatory Visit: Payer: Self-pay

## 2021-07-23 ENCOUNTER — Ambulatory Visit
Admission: RE | Admit: 2021-07-23 | Discharge: 2021-07-23 | Disposition: A | Discharge status: Home / Self Care | Payer: Medicare Other | Source: Ambulatory Visit | Attending: Psychiatry | Admitting: Psychiatry

## 2021-07-23 DIAGNOSIS — M5412 Radiculopathy, cervical region: Secondary | ICD-10-CM

## 2021-07-30 ENCOUNTER — Telehealth: Payer: Self-pay

## 2021-07-30 NOTE — Telephone Encounter (Signed)
I called the pt and relayed results of MRI. She verbalized understanding and will continue gabapentin and keep her f/u as scheduled for June.  ?

## 2021-07-30 NOTE — Telephone Encounter (Signed)
-----   Message from Genia Harold, MD sent at 07/30/2021  9:18 AM EDT ----- ?MRI C-spine shows some mild-moderate narrowing in her neck at C3-4 and C4-5, which can contribute to neck pain and headache. She can continue with gabapentin for now ?

## 2021-08-08 ENCOUNTER — Ambulatory Visit: Payer: Medicare Other | Admitting: Family Medicine

## 2021-08-08 ENCOUNTER — Ambulatory Visit: Payer: Medicare Other

## 2021-08-10 ENCOUNTER — Encounter: Payer: Self-pay | Admitting: Family Medicine

## 2021-08-10 ENCOUNTER — Ambulatory Visit: Payer: Medicare Other

## 2021-08-10 ENCOUNTER — Ambulatory Visit (INDEPENDENT_AMBULATORY_CARE_PROVIDER_SITE_OTHER): Payer: Medicare Other | Admitting: Family Medicine

## 2021-08-10 VITALS — BP 130/74 | HR 71 | Temp 98.0°F | Ht 65.0 in | Wt 174.2 lb

## 2021-08-10 DIAGNOSIS — E8941 Symptomatic postprocedural ovarian failure: Secondary | ICD-10-CM
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
## 2021-08-17 ENCOUNTER — Other Ambulatory Visit: Payer: Self-pay | Admitting: Family Medicine

## 2021-08-17 DIAGNOSIS — I10 Essential (primary) hypertension: Secondary | ICD-10-CM

## 2021-08-27 ENCOUNTER — Other Ambulatory Visit: Payer: Self-pay | Admitting: Family Medicine

## 2021-08-27 DIAGNOSIS — E8941 Symptomatic postprocedural ovarian failure: Secondary | ICD-10-CM

## 2021-09-26 ENCOUNTER — Ambulatory Visit (INDEPENDENT_AMBULATORY_CARE_PROVIDER_SITE_OTHER): Payer: Medicare Other | Admitting: Emergency Medicine

## 2021-09-26 ENCOUNTER — Telehealth: Payer: Self-pay | Admitting: *Deleted

## 2021-09-26 DIAGNOSIS — Z23 Encounter for immunization: Secondary | ICD-10-CM

## 2021-09-26 NOTE — Telephone Encounter (Signed)
Approved today ?Request Reference Number: [id number]. QULIPTA TAB '60MG'$  is approved through 05/19/2022. Your patient may now fill this prescription and it will be covered. Approval faxed to pharmacy. ?

## 2021-09-26 NOTE — Telephone Encounter (Signed)
Received e mail re: documents attached to key.  ?Your information has been sent to [facility]. ?

## 2021-09-26 NOTE — Telephone Encounter (Signed)
[place] PA, Key: [id number]. Faxed office notes to be attached to key. ?

## 2021-09-27 ENCOUNTER — Other Ambulatory Visit: Payer: Self-pay | Admitting: Family Medicine

## 2021-09-27 DIAGNOSIS — Z9889 Other specified postprocedural states: Secondary | ICD-10-CM | POA: Diagnosis not present

## 2021-09-27 DIAGNOSIS — Z029 Encounter for administrative examinations, unspecified: Secondary | ICD-10-CM

## 2021-09-27 DIAGNOSIS — M546 Pain in thoracic spine: Secondary | ICD-10-CM | POA: Diagnosis not present

## 2021-09-27 DIAGNOSIS — R202 Paresthesia of skin: Secondary | ICD-10-CM

## 2021-10-23 ENCOUNTER — Other Ambulatory Visit: Payer: Self-pay | Admitting: Psychiatry

## 2021-11-13 NOTE — Progress Notes (Deleted)
   CC:  headaches  Follow-up Visit  Last visit: 07/16/21  Brief HPI: 56 year old female with a history of HTN, COPD, lumbar stenosis, HLD, smoking who follows in clinic for chronic migraines.  At her last visit her gabapentin was increased to 900 mg TID. She was started on Qulipta for migraine prevention. MRI C-spine was ordered. Interval History: *** MRI C-spine showed mild-moderate stenosis at C3-4 and C4-5.  Headache days per month: *** Headache free days per month: *** Headache severity: ***  Current Headache Regimen: Preventative: *** Abortive: ***  # of doses of abortive medications per month: ***  Prior Therapies                                  ***  Physical Exam:   Vital Signs: There were no vitals taken for this visit. GENERAL:  well appearing, in no acute distress, alert  SKIN:  Color, texture, turgor normal. No rashes or lesions HEAD:  Normocephalic/atraumatic. RESP: normal respiratory effort MSK:  No gross joint deformities.   NEUROLOGICAL: Mental Status: Alert, oriented to person, place and time, Follows commands, and Speech fluent and appropriate. Cranial Nerves: PERRL, face symmetric, no dysarthria, hearing grossly intact Motor: moves all extremities equally Gait: normal-based.  IMPRESSION: ***  PLAN: ***   Follow-up: ***  I spent a total of *** minutes on the date of the service. Headache education was done. Discussed lifestyle modification including increased oral hydration, decreased caffeine, exercise and stress management. Discussed treatment options including preventive and acute medications, natural supplements, and infusion therapy. Discussed medication overuse headache and to limit use of acute treatments to no more than 2 days/week or 10 days/month. Discussed medication side effects, adverse reactions and drug interactions. Written educational materials and patient instructions outlining all of the above were given.  Genia Harold,  MD

## 2021-11-14 ENCOUNTER — Ambulatory Visit: Payer: Medicare Other | Admitting: Psychiatry

## 2021-11-28 ENCOUNTER — Other Ambulatory Visit: Payer: Self-pay | Admitting: Family Medicine

## 2021-11-28 DIAGNOSIS — I10 Essential (primary) hypertension: Secondary | ICD-10-CM

## 2021-12-04 DIAGNOSIS — M546 Pain in thoracic spine: Secondary | ICD-10-CM | POA: Diagnosis not present

## 2021-12-04 DIAGNOSIS — Z9889 Other specified postprocedural states: Secondary | ICD-10-CM | POA: Diagnosis not present

## 2021-12-21 ENCOUNTER — Other Ambulatory Visit: Payer: Self-pay | Admitting: Family Medicine

## 2021-12-21 ENCOUNTER — Other Ambulatory Visit: Payer: Self-pay | Admitting: Psychiatry

## 2021-12-21 DIAGNOSIS — F411 Generalized anxiety disorder: Secondary | ICD-10-CM

## 2021-12-21 DIAGNOSIS — I10 Essential (primary) hypertension: Secondary | ICD-10-CM

## 2022-01-30 ENCOUNTER — Other Ambulatory Visit: Payer: Self-pay | Admitting: Family Medicine

## 2022-01-30 DIAGNOSIS — I10 Essential (primary) hypertension: Secondary | ICD-10-CM

## 2022-01-30 DIAGNOSIS — F411 Generalized anxiety disorder: Secondary | ICD-10-CM

## 2022-01-30 DIAGNOSIS — E7800 Pure hypercholesterolemia, unspecified: Secondary | ICD-10-CM

## 2022-02-04 ENCOUNTER — Other Ambulatory Visit: Payer: Self-pay | Admitting: Family Medicine

## 2022-02-04 DIAGNOSIS — I10 Essential (primary) hypertension: Secondary | ICD-10-CM

## 2022-02-11 DIAGNOSIS — M25551 Pain in right hip: Secondary | ICD-10-CM | POA: Diagnosis not present

## 2022-02-11 DIAGNOSIS — M546 Pain in thoracic spine: Secondary | ICD-10-CM | POA: Diagnosis not present

## 2022-02-11 DIAGNOSIS — M5416 Radiculopathy, lumbar region: Secondary | ICD-10-CM | POA: Diagnosis not present

## 2022-02-11 DIAGNOSIS — G894 Chronic pain syndrome: Secondary | ICD-10-CM | POA: Diagnosis not present

## 2022-02-11 DIAGNOSIS — Z9889 Other specified postprocedural states: Secondary | ICD-10-CM | POA: Diagnosis not present

## 2022-02-12 ENCOUNTER — Ambulatory Visit (INDEPENDENT_AMBULATORY_CARE_PROVIDER_SITE_OTHER): Payer: Medicare Other | Admitting: Family Medicine

## 2022-02-12 ENCOUNTER — Encounter: Payer: Self-pay | Admitting: Family Medicine

## 2022-02-12 VITALS — BP 126/67 | HR 75 | Temp 97.9°F | Resp 20 | Ht 65.0 in | Wt 164.0 lb

## 2022-02-12 DIAGNOSIS — Z23 Encounter for immunization: Secondary | ICD-10-CM | POA: Diagnosis not present

## 2022-02-12 DIAGNOSIS — E8941 Symptomatic postprocedural ovarian failure: Secondary | ICD-10-CM | POA: Diagnosis not present

## 2022-02-12 DIAGNOSIS — F339 Major depressive disorder, recurrent, unspecified: Secondary | ICD-10-CM | POA: Diagnosis not present

## 2022-02-12 DIAGNOSIS — Z0000 Encounter for general adult medical examination without abnormal findings: Secondary | ICD-10-CM | POA: Diagnosis not present

## 2022-02-12 DIAGNOSIS — F411 Generalized anxiety disorder: Secondary | ICD-10-CM

## 2022-02-12 DIAGNOSIS — K219 Gastro-esophageal reflux disease without esophagitis: Secondary | ICD-10-CM | POA: Diagnosis not present

## 2022-02-12 DIAGNOSIS — N3941 Urge incontinence: Secondary | ICD-10-CM

## 2022-02-12 DIAGNOSIS — I10 Essential (primary) hypertension: Secondary | ICD-10-CM | POA: Diagnosis not present

## 2022-02-12 DIAGNOSIS — Z0001 Encounter for general adult medical examination with abnormal findings: Secondary | ICD-10-CM | POA: Diagnosis not present

## 2022-02-12 MED ORDER — OMEPRAZOLE 40 MG PO CPDR: 40.0000 mg | DELAYED_RELEASE_CAPSULE | Freq: Every day | ORAL | 1 refills | Status: DC

## 2022-02-12 MED ORDER — FLUOXETINE HCL 20 MG PO CAPS: 20.0000 mg | ORAL_CAPSULE | Freq: Every day | ORAL | 1 refills | Status: DC

## 2022-02-12 MED ORDER — OXYBUTYNIN CHLORIDE ER 10 MG PO TB24: 10.0000 mg | ORAL_TABLET | Freq: Every day | ORAL | 1 refills | Status: DC

## 2022-02-12 MED ORDER — BUSPIRONE HCL 15 MG PO TABS: 15.0000 mg | ORAL_TABLET | Freq: Two times a day (BID) | ORAL | 1 refills | Status: DC

## 2022-02-12 MED ORDER — LISINOPRIL 20 MG PO TABS: 20.0000 mg | ORAL_TABLET | Freq: Every day | ORAL | 1 refills | Status: DC

## 2022-02-12 MED ORDER — FAMOTIDINE 40 MG PO TABS: ORAL_TABLET | ORAL | 1 refills | Status: DC

## 2022-02-12 NOTE — Patient Instructions (Signed)
Call Dr. Billey Gosling and schedule your follow-up due to uncontrolled headaches.

## 2022-02-12 NOTE — Addendum Note (Signed)
Addended by: Zannie Cove on: 02/12/2022 03:47 PM   Modules accepted: Orders
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
## 2022-02-13 ENCOUNTER — Encounter: Payer: Self-pay | Admitting: Family Medicine

## 2022-02-13 LAB — CBC WITH DIFFERENTIAL/PLATELET
Basophils Absolute: 0 10*3/uL (ref 0.0–0.2)
Basos: 0 %
EOS (ABSOLUTE): 0.3 10*3/uL (ref 0.0–0.4)
Eos: 3 %
Hematocrit: 46.1 % (ref 34.0–46.6)
Hemoglobin: 16.1 g/dL — ABNORMAL HIGH (ref 11.1–15.9)
Immature Grans (Abs): 0 10*3/uL (ref 0.0–0.1)
Immature Granulocytes: 0 %
Lymphocytes Absolute: 2.9 10*3/uL (ref 0.7–3.1)
Lymphs: 28 %
MCH: 32.2 pg (ref 26.6–33.0)
MCHC: 34.9 g/dL (ref 31.5–35.7)
MCV: 92 fL (ref 79–97)
Monocytes Absolute: 1 10*3/uL — ABNORMAL HIGH (ref 0.1–0.9)
Monocytes: 10 %
Neutrophils Absolute: 6.1 10*3/uL (ref 1.4–7.0)
Neutrophils: 59 %
Platelets: 226 10*3/uL (ref 150–450)
RBC: 5 x10E6/uL (ref 3.77–5.28)
RDW: 12.4 % (ref 11.7–15.4)
WBC: 10.4 10*3/uL (ref 3.4–10.8)

## 2022-02-13 LAB — CMP14+EGFR
ALT: 11 IU/L (ref 0–32)
AST: 16 IU/L (ref 0–40)
Albumin/Globulin Ratio: 2 (ref 1.2–2.2)
Albumin: 4.6 g/dL (ref 3.8–4.9)
Alkaline Phosphatase: 81 IU/L (ref 44–121)
BUN/Creatinine Ratio: 19 (ref 9–23)
BUN: 11 mg/dL (ref 6–24)
Bilirubin Total: 0.2 mg/dL (ref 0.0–1.2)
CO2: 24 mmol/L (ref 20–29)
Calcium: 10.1 mg/dL (ref 8.7–10.2)
Chloride: 102 mmol/L (ref 96–106)
Creatinine, Ser: 0.59 mg/dL (ref 0.57–1.00)
Globulin, Total: 2.3 g/dL (ref 1.5–4.5)
Glucose: 114 mg/dL — ABNORMAL HIGH (ref 70–99)
Potassium: 4.7 mmol/L (ref 3.5–5.2)
Sodium: 140 mmol/L (ref 134–144)
Total Protein: 6.9 g/dL (ref 6.0–8.5)
eGFR: 104 mL/min/{1.73_m2} (ref >59)

## 2022-02-13 LAB — LIPID PANEL
Chol/HDL Ratio: 5.1 ratio — ABNORMAL HIGH (ref 0.0–4.4)
Cholesterol, Total: 183 mg/dL (ref 100–199)
HDL: 36 mg/dL — ABNORMAL LOW (ref >39)
LDL Chol Calc (NIH): 94 mg/dL (ref 0–99)
Triglycerides: 316 mg/dL — ABNORMAL HIGH (ref 0–149)
VLDL Cholesterol Cal: 53 mg/dL — ABNORMAL HIGH (ref 5–40)

## 2022-02-13 LAB — TSH: TSH: 0.515 u[IU]/mL (ref 0.450–4.500)

## 2022-03-08 ENCOUNTER — Other Ambulatory Visit: Payer: Self-pay | Admitting: Family Medicine

## 2022-03-25 NOTE — Patient Instructions (Incomplete)
Urinary Incontinence Urinary incontinence refers to a condition in which a person is unable to control where and when to pass urine. A person with this condition will urinate involuntarily. This means that the person urinates when he or she does not mean to. What are the causes? This condition may be caused by: Medicines. Infections. Constipation. Overactive bladder muscles. Weak bladder muscles. Weak pelvic floor muscles. These muscles provide support for the bladder, intestine, and, in women, the uterus. Enlarged prostate in men. The prostate is a gland near the bladder. When it gets too big, it can pinch the urethra. With the urethra blocked, the bladder can weaken and lose the ability to empty properly. Surgery. Emotional factors, such as anxiety, stress, or post-traumatic stress disorder (PTSD). Spinal cord injury, nerve injury, or other neurological conditions. Pelvic organ prolapse. This happens in women when organs move out of place and into the vagina. This movement can prevent the bladder and urethra from working properly. What increases the risk? The following factors may make you more likely to develop this condition: Age. The older you are, the higher the risk. Obesity. Being physically inactive. Pregnancy and childbirth. Menopause. Diseases that affect the nerves or spinal cord. Long-term, or chronic, coughing. This can increase pressure on the bladder and pelvic floor muscles. What are the signs or symptoms? Symptoms may vary depending on the type of urinary incontinence you have. They include: A sudden urge to urinate, and passing urine involuntarily before you can get to a bathroom (urge incontinence). Suddenly passing urine when doing activities that force urine to pass, such as coughing, laughing, exercising, or sneezing (stress incontinence). Needing to urinate often but urinating only a small amount, or constantly dribbling urine (overflow incontinence). Urinating  because you cannot get to the bathroom in time due to a physical disability, such as arthritis or injury, or due to a communication or thinking problem, such as Alzheimer's disease (functional incontinence). How is this diagnosed? This condition may be diagnosed based on: Your medical history. A physical exam. Tests, such as: Urine tests. X-rays of your kidney and bladder. Ultrasound. CT scan. Cystoscopy. In this procedure, a health care provider inserts a tube with a light and camera (cystoscope) through the urethra and into the bladder to check for problems. Urodynamic testing. These tests assess how well the bladder, urethra, and sphincter can store and release urine. There are different types of urodynamic tests, and they vary depending on what the test is measuring. To help diagnose your condition, your health care provider may recommend that you keep a log of when you urinate and how much you urinate. How is this treated? Treatment for this condition depends on the type of incontinence that you have and its cause. Treatment may include: Lifestyle changes, such as: Quitting smoking. Maintaining a healthy weight. Staying active. Try to get 150 minutes of moderate-intensity exercise every week. Ask your health care provider which activities are safe for you. Eating a healthy diet. Avoid high-fat foods, like fried foods. Avoid refined carbohydrates like white bread and white rice. Limit how much alcohol and caffeine you drink. Increase your fiber intake. Healthy sources of fiber include beans, whole grains, and fresh fruits and vegetables. Behavioral changes, such as: Pelvic floor muscle exercises. Bladder training, such as lengthening the amount of time between bathroom breaks, or using the bathroom at regular intervals. Using techniques to suppress bladder urges. This can include distraction techniques or controlled breathing exercises. Medicines, such as: Medicines to relax the  bladder   muscles and prevent bladder spasms. Medicines to help slow or prevent the growth of a man's prostate. Botox injections. These can help relax the bladder muscles. Treatments, such as: Using pulses of electricity to help change bladder reflexes (electrical nerve stimulation). For women, using a medical device to prevent urine leaks. This is a small, tampon-like, disposable device that is inserted into the urethra. Injecting collagen or carbon beads (bulking agents) into the urinary sphincter. These can help thicken tissue and close the bladder opening. Surgery. Follow these instructions at home: Lifestyle Limit alcohol and caffeine. These can fill your bladder quickly and irritate it. Keep yourself clean to help prevent odors and skin damage. Ask your health care provider about special skin creams and cleansers that can protect the skin from urine. Consider wearing pads or adult diapers. Make sure to change them regularly, and always change them right after experiencing incontinence. General instructions Take over-the-counter and prescription medicines only as told by your health care provider. Use the bathroom about every 3-4 hours, even if you do not feel the need to urinate. Try to empty your bladder completely every time. After urinating, wait a minute. Then try to urinate again. Make sure you are in a relaxed position while urinating. If your incontinence is caused by nerve problems, keep a log of the medicines you take and the times you go to the bathroom. Keep all follow-up visits. This is important. Where to find more information [employer] of Diabetes and Digestive and Kidney Diseases: [employer] American Urology Association: www.urologyhealth.org Contact a health care provider if: You have pain that gets worse. Your incontinence gets worse. Get help right away if: You have a fever or chills. You are unable to urinate. You have redness in your groin area or  down your legs. Summary Urinary incontinence refers to a condition in which a person is unable to control where and when to pass urine. This condition may be caused by medicines, infection, weak bladder muscles, weak pelvic floor muscles, enlargement of the prostate (in men), or surgery. Factors such as older age, obesity, pregnancy and childbirth, menopause, neurological diseases, and chronic coughing may increase your risk for developing this condition. Types of urinary incontinence include urge incontinence, stress incontinence, overflow incontinence, and functional incontinence. This condition is usually treated first with lifestyle and behavioral changes, such as quitting smoking, eating a healthier diet, and doing regular pelvic floor exercises. Other treatment options include medicines, bulking agents, medical devices, electrical nerve stimulation, or surgery. This information is not intended to replace advice given to you by your health care provider. Make sure you discuss any questions you have with your health care provider. Document Revised: 12/10/2019 Document Reviewed: 12/10/2019 Elsevier Patient Education  [facility] records indicate that you are due for your annual mammogram/breast imaging. While there is no way to prevent breast cancer, early detection provides the best opportunity for curing it. For women over the age of 62, the [place] recommends a yearly clinical breast exam and a yearly mammogram. These practices have saved thousands of lives. We need your help to ensure that you are receiving optimal medical care. Please call the imaging location that has done you previous mammograms. Please remember to list [place] as your primary care. This helps make sure we receive a report and can update your chart.  Below is the contact information for several local breast imaging centers. You may call the location that works best for you, and they will be happy  to  assistance in making you an appointment. You do not need an order for a regular screening mammogram. However, if you are having any problems or concerns with you breast area, please let your primary care provider know, and appropriate orders will be placed. Please let our office know if you have any questions or concerns. Or if you need information for another imaging center not on this list or outside of the area. We are commented to working with you on your health care journey.   The mobile unit/bus ([facility]) - they come twice a month to our location.  These appointments can be made through our office or by call [facility]  [place], [place] [address] Phone [phone]  [facility] Rad[facility] Department  [address] [place], [place] [address] [phone]  [facility] ([facility])  [phone] [address], [place] [address] [phone]  [place] [address] [place] [address] [phone]  [place]  [address], [place] [address] [phone]  [facility] in [place], [place] [address] [phone]  [facility] [place], [place] [address] [phone]  [facility]  [place], [place] [address] [phone]  [place] [place] [address] [phone]

## 2022-03-26 ENCOUNTER — Encounter: Payer: Self-pay | Admitting: Nurse Practitioner

## 2022-03-26 ENCOUNTER — Ambulatory Visit (INDEPENDENT_AMBULATORY_CARE_PROVIDER_SITE_OTHER): Payer: Medicare Other | Admitting: Nurse Practitioner

## 2022-03-26 DIAGNOSIS — N3941 Urge incontinence: Secondary | ICD-10-CM | POA: Diagnosis not present

## 2022-03-26 NOTE — Progress Notes (Signed)
   Virtual Visit  Note Due to COVID-19 pandemic this visit was conducted virtually. This visit type was conducted due to national recommendations for restrictions regarding the COVID-19 Pandemic (e.g. social distancing, sheltering in place) in an effort to limit this patient's exposure and mitigate transmission in our community. All issues noted in this document were discussed and addressed.  A physical exam was not performed with this format.  I connected with Susan Ward on 03/26/22 at 11:10 AM by telephone and verified that I am speaking with the correct person using two identifiers. Susan Ward is currently located at home during visit. The provider, Ivy Lynn, NP is located in their remote office at time of visit.  I discussed the limitations, risks, security and privacy concerns of performing an evaluation and management service by telephone and the availability of in person appointments. I also discussed with the patient that there may be a patient responsible charge related to this service. The patient expressed understanding and agreed to proceed.   History and Present Illness:  HPI    Review of Systems  Constitutional: Negative.   HENT: Negative.    Respiratory: Negative.    Cardiovascular: Negative.   Gastrointestinal: Negative.   Genitourinary: Negative.  Negative for dysuria, frequency and urgency.  Musculoskeletal: Negative.   Skin: Negative.   All other systems reviewed and are negative.    Observations/Objective: Televisit patient not in distress  Assessment and Plan: Patient is reporting well-controlled incontinence with Detrol LA.  Patient also reports that Detrol LA is helping with her hot flash symptoms.  No changes made to current regimen.  Follow-up with worsening symptoms.  Follow Up Instructions: As needed.    I discussed the assessment and treatment plan with the patient. The patient was provided an opportunity to ask questions and all were  answered. The patient agreed with the plan and demonstrated an understanding of the instructions.   The patient was advised to call back or seek an in-person evaluation if the symptoms worsen or if the condition fails to improve as anticipated.  The above assessment and management plan was discussed with the patient. The patient verbalized understanding of and has agreed to the management plan. Patient is aware to call the clinic if symptoms persist or worsen. Patient is aware when to return to the clinic for a follow-up visit. Patient educated on when it is appropriate to go to the emergency department.   Time call ended:  11:18 AM  I provided 8 minutes of  non face-to-face time during this encounter.    Ivy Lynn, NP

## 2022-04-01 ENCOUNTER — Other Ambulatory Visit: Payer: Self-pay | Admitting: Family Medicine

## 2022-04-01 ENCOUNTER — Other Ambulatory Visit: Payer: Self-pay | Admitting: Psychiatry

## 2022-04-01 DIAGNOSIS — J302 Other seasonal allergic rhinitis: Secondary | ICD-10-CM

## 2022-05-01 ENCOUNTER — Other Ambulatory Visit: Payer: Self-pay | Admitting: Nurse Practitioner

## 2022-05-01 ENCOUNTER — Other Ambulatory Visit: Payer: Self-pay | Admitting: Psychiatry

## 2022-05-01 ENCOUNTER — Other Ambulatory Visit: Payer: Self-pay | Admitting: Family Medicine

## 2022-05-01 DIAGNOSIS — R202 Paresthesia of skin: Secondary | ICD-10-CM

## 2022-05-01 DIAGNOSIS — N3941 Urge incontinence: Secondary | ICD-10-CM

## 2022-05-01 DIAGNOSIS — E8941 Symptomatic postprocedural ovarian failure: Secondary | ICD-10-CM

## 2022-05-01 MED ORDER — OXYBUTYNIN CHLORIDE ER 10 MG PO TB24: 10.0000 mg | ORAL_TABLET | Freq: Every day | ORAL | 1 refills | Status: DC

## 2022-05-09 ENCOUNTER — Encounter: Payer: Self-pay | Admitting: Nurse Practitioner

## 2022-05-09 ENCOUNTER — Ambulatory Visit (INDEPENDENT_AMBULATORY_CARE_PROVIDER_SITE_OTHER): Payer: Medicare Other | Admitting: Nurse Practitioner

## 2022-05-09 DIAGNOSIS — J0110 Acute frontal sinusitis, unspecified: Secondary | ICD-10-CM

## 2022-05-09 MED ORDER — DOXYCYCLINE HYCLATE 100 MG PO TABS: 100.0000 mg | ORAL_TABLET | Freq: Two times a day (BID) | ORAL | 0 refills | Status: DC

## 2022-05-09 NOTE — Patient Instructions (Signed)

## 2022-05-09 NOTE — Progress Notes (Signed)
Virtual Visit  Note Due to COVID-19 pandemic this visit was conducted virtually. This visit type was conducted due to national recommendations for restrictions regarding the COVID-19 Pandemic (e.g. social distancing, sheltering in place) in an effort to limit this patient's exposure and mitigate transmission in our community. All issues noted in this document were discussed and addressed.  A physical exam was not performed with this format.  I connected with Susan Ward on 05/09/22 at 9:18 by telephone and verified that I am speaking with the correct person using two identifiers. Susan Ward is currently located at home and no one is currently with her during visit. The provider, Mary-Margaret Hassell Done, FNP is located in their office at time of visit.  I discussed the limitations, risks, security and privacy concerns of performing an evaluation and management service by telephone and the availability of in person appointments. I also discussed with the patient that there may be a patient responsible charge related to this service. The patient expressed understanding and agreed to proceed.   History and Present Illness:  Patient came into office yesterday to be seen but there were no appointments  Sinusitis This is a new problem. Episode onset: 4 days ago. The problem has been gradually worsening since onset. There has been no fever. Her pain is at a severity of 4/10. Associated symptoms include congestion, ear pain, sinus pressure and a sore throat. Treatments tried: mucinex and dayquil. The treatment provided mild relief.      Review of Systems  HENT:  Positive for congestion, ear pain, sinus pressure and sore throat.      Observations/Objective: Alert and oriented- answers all questions appropriately No distress Left eye pressure-knot bekow left eye Raspy voice No cough  Assessment and Plan: Susan Ward in today with chief complaint of No chief complaint on file.   1. Acute  non-recurrent frontal sinusitis 1. Take meds as prescribed 2. Use a cool mist humidifier especially during the winter months and when heat has been humid. 3. Use saline nose sprays frequently 4. Saline irrigations of the nose can be very helpful if done frequently.  * 4X daily for 1 week*  * Use of a nettie pot can be helpful with this. Follow directions with this* 5. Drink plenty of fluids 6. Keep thermostat turn down low 7.For any cough or congestion- mucinex 8. For fever or aces or pains- take tylenol or ibuprofen appropriate for age and weight.  * for fevers greater than 101 orally you may alternate ibuprofen and tylenol every  3 hours.   Meds ordered this encounter  Medications   doxycycline (VIBRA-TABS) 100 MG tablet    Sig: Take 1 tablet (100 mg total) by mouth 2 (two) times daily.    Dispense:  20 tablet    Refill:  0    Order Specific Question:   Supervising Provider    Answer:   Caryl Pina A [[id number]]      Follow Up Instructions: prn    I discussed the assessment and treatment plan with the patient. The patient was provided an opportunity to ask questions and all were answered. The patient agreed with the plan and demonstrated an understanding of the instructions.   The patient was advised to call back or seek an in-person evaluation if the symptoms worsen or if the condition fails to improve as anticipated.  The above assessment and management plan was discussed with the patient. The patient verbalized understanding of and has agreed to  the management plan. Patient is aware to call the clinic if symptoms persist or worsen. Patient is aware when to return to the clinic for a follow-up visit. Patient educated on when it is appropriate to go to the emergency department.   Time call ended:  9:30  I provided 12 minutes of  non face-to-face time during this encounter.    Mary-Margaret Hassell Done, FNP

## 2022-05-22 DIAGNOSIS — M5416 Radiculopathy, lumbar region: Secondary | ICD-10-CM | POA: Diagnosis not present

## 2022-05-22 DIAGNOSIS — G894 Chronic pain syndrome: Secondary | ICD-10-CM | POA: Diagnosis not present

## 2022-05-22 DIAGNOSIS — M25551 Pain in right hip: Secondary | ICD-10-CM | POA: Diagnosis not present

## 2022-05-23 ENCOUNTER — Telehealth: Payer: Self-pay | Admitting: Family Medicine

## 2022-05-23 NOTE — Telephone Encounter (Signed)
No vm is set up

## 2022-05-23 NOTE — Telephone Encounter (Signed)
Patient had a virtual appointment on 12/21 and is still having issues with her right ear and a knot under her eye, thinks that she needs a stronger antibiotic. Please call back and advise.

## 2022-05-27 NOTE — Telephone Encounter (Signed)
NA °No call back  °This encounter will be closed  °

## 2022-05-27 NOTE — Telephone Encounter (Signed)
Pt came in to office about this message. Pt says that she wants a stronger abx. She still have ear infection and bump in under eye. Use the [facility]

## 2022-05-31 NOTE — Telephone Encounter (Signed)
Still no answer, and no voicemail.  Encounter closed.

## 2022-06-13 ENCOUNTER — Encounter: Payer: Self-pay | Admitting: Family Medicine

## 2022-06-13 ENCOUNTER — Ambulatory Visit (INDEPENDENT_AMBULATORY_CARE_PROVIDER_SITE_OTHER): Payer: Medicare Other | Admitting: Family Medicine

## 2022-06-13 ENCOUNTER — Telehealth: Payer: Self-pay | Admitting: Family Medicine

## 2022-06-13 VITALS — BP 126/67 | HR 62 | Temp 97.7°F | Ht 65.0 in | Wt 164.2 lb

## 2022-06-13 DIAGNOSIS — K219 Gastro-esophageal reflux disease without esophagitis: Secondary | ICD-10-CM | POA: Diagnosis not present

## 2022-06-13 DIAGNOSIS — F339 Major depressive disorder, recurrent, unspecified: Secondary | ICD-10-CM

## 2022-06-13 DIAGNOSIS — R232 Flushing: Secondary | ICD-10-CM | POA: Diagnosis not present

## 2022-06-13 DIAGNOSIS — I10 Essential (primary) hypertension: Secondary | ICD-10-CM | POA: Diagnosis not present

## 2022-06-13 DIAGNOSIS — H6993 Unspecified Eustachian tube disorder, bilateral: Secondary | ICD-10-CM

## 2022-06-13 DIAGNOSIS — J449 Chronic obstructive pulmonary disease, unspecified: Secondary | ICD-10-CM

## 2022-06-13 DIAGNOSIS — R202 Paresthesia of skin: Secondary | ICD-10-CM | POA: Diagnosis not present

## 2022-06-13 DIAGNOSIS — E7800 Pure hypercholesterolemia, unspecified: Secondary | ICD-10-CM | POA: Diagnosis not present

## 2022-06-13 MED ORDER — TRELEGY ELLIPTA 100-62.5-25 MCG/ACT IN AEPB: 1.0000 | INHALATION_SPRAY | Freq: Every day | RESPIRATORY_TRACT | 11 refills | Status: DC

## 2022-06-13 MED ORDER — FLUTICASONE PROPIONATE 50 MCG/ACT NA SUSP: 1.0000 | Freq: Two times a day (BID) | NASAL | 11 refills | Status: AC

## 2022-06-13 MED ORDER — GABAPENTIN 800 MG PO TABS: ORAL_TABLET | ORAL | 1 refills | Status: AC

## 2022-06-13 MED ORDER — ESOMEPRAZOLE MAGNESIUM 40 MG PO CPDR: 40.0000 mg | DELAYED_RELEASE_CAPSULE | Freq: Every day | ORAL | 3 refills | Status: AC

## 2022-06-13 MED ORDER — GABAPENTIN 100 MG PO CAPS: ORAL_CAPSULE | ORAL | 1 refills | Status: AC

## 2022-06-13 MED ORDER — METHYLPREDNISOLONE ACETATE 40 MG/ML IJ SUSP
40.0000 mg | Freq: Once | INTRAMUSCULAR | Status: AC
  Administered 2022-06-13: 40 mg via INTRAMUSCULAR
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
## 2022-06-13 NOTE — Telephone Encounter (Signed)
Can Fluticasone-Umeclidin-Vilant (TRELEGY ELLIPTA) 100-62.5-25 MCG/ACT AEPB  rx be re sent with qt of 60? Please call back

## 2022-06-13 NOTE — Telephone Encounter (Signed)
Rx resent.

## 2022-07-01 ENCOUNTER — Other Ambulatory Visit: Payer: Self-pay | Admitting: Family Medicine

## 2022-07-01 DIAGNOSIS — F411 Generalized anxiety disorder: Secondary | ICD-10-CM

## 2022-07-01 DIAGNOSIS — F339 Major depressive disorder, recurrent, unspecified: Secondary | ICD-10-CM

## 2022-07-04 LAB — LIPID PANEL
Chol/HDL Ratio: 5.7 ratio — ABNORMAL HIGH (ref 0.0–4.4)
Cholesterol, Total: 193 mg/dL (ref 100–199)
HDL: 34 mg/dL — ABNORMAL LOW (ref >39)
LDL Chol Calc (NIH): 87 mg/dL (ref 0–99)
Triglycerides: 441 mg/dL — ABNORMAL HIGH (ref 0–149)
VLDL Cholesterol Cal: 72 mg/dL — ABNORMAL HIGH (ref 5–40)

## 2022-07-04 LAB — HORMONE PANEL (T4,TSH,FSH,TESTT,SHBG,DHEA,ETC)
DHEA-Sulfate, LCMS: <10 ug/dL
Estradiol, Serum, MS: <1 pg/mL
Estrone Sulfate: <10 ng/dL
Follicle Stimulating Hormone: 56 m[IU]/mL
Free T-3: 3 pg/mL
Free Testosterone, Serum: 0.5 pg/mL — ABNORMAL LOW
Progesterone, Serum: <10 ng/dL
Sex Hormone Binding Globulin: 41.8 nmol/L
T4: 7.4 ug/dL
TSH: 1.1 uU/mL
Testosterone, Serum (Total): 5 ng/dL
Testosterone-% Free: 1 %
Triiodothyronine (T-3), Serum: 116 ng/dL

## 2022-07-04 LAB — CBC WITH DIFFERENTIAL/PLATELET
Basophils Absolute: 0 10*3/uL (ref 0.0–0.2)
Basos: 0 %
EOS (ABSOLUTE): 0.3 10*3/uL (ref 0.0–0.4)
Eos: 3 %
Hematocrit: 42.5 % (ref 34.0–46.6)
Hemoglobin: 14.8 g/dL (ref 11.1–15.9)
Immature Grans (Abs): 0 10*3/uL (ref 0.0–0.1)
Immature Granulocytes: 0 %
Lymphocytes Absolute: 3.3 10*3/uL — ABNORMAL HIGH (ref 0.7–3.1)
Lymphs: 36 %
MCH: 32.2 pg (ref 26.6–33.0)
MCHC: 34.8 g/dL (ref 31.5–35.7)
MCV: 92 fL (ref 79–97)
Monocytes Absolute: 1 10*3/uL — ABNORMAL HIGH (ref 0.1–0.9)
Monocytes: 11 %
Neutrophils Absolute: 4.5 10*3/uL (ref 1.4–7.0)
Neutrophils: 50 %
Platelets: 213 10*3/uL (ref 150–450)
RBC: 4.6 x10E6/uL (ref 3.77–5.28)
RDW: 12.5 % (ref 11.7–15.4)
WBC: 9.1 10*3/uL (ref 3.4–10.8)

## 2022-07-04 LAB — CMP14+EGFR
ALT: 16 IU/L (ref 0–32)
AST: 19 IU/L (ref 0–40)
Albumin/Globulin Ratio: 1.9 (ref 1.2–2.2)
Albumin: 4.3 g/dL (ref 3.8–4.9)
Alkaline Phosphatase: 73 IU/L (ref 44–121)
BUN/Creatinine Ratio: 20 (ref 9–23)
BUN: 12 mg/dL (ref 6–24)
Bilirubin Total: <0.2 mg/dL (ref 0.0–1.2)
CO2: 24 mmol/L (ref 20–29)
Calcium: 10 mg/dL (ref 8.7–10.2)
Chloride: 103 mmol/L (ref 96–106)
Creatinine, Ser: 0.6 mg/dL (ref 0.57–1.00)
Globulin, Total: 2.3 g/dL (ref 1.5–4.5)
Glucose: 101 mg/dL — ABNORMAL HIGH (ref 70–99)
Potassium: 4.4 mmol/L (ref 3.5–5.2)
Sodium: 141 mmol/L (ref 134–144)
Total Protein: 6.6 g/dL (ref 6.0–8.5)
eGFR: 104 mL/min/{1.73_m2} (ref >59)

## 2022-07-22 ENCOUNTER — Ambulatory Visit (INDEPENDENT_AMBULATORY_CARE_PROVIDER_SITE_OTHER): Payer: Medicare Other

## 2022-07-22 VITALS — Ht 65.0 in | Wt 170.0 lb

## 2022-07-22 DIAGNOSIS — Z0000 Encounter for general adult medical examination without abnormal findings: Secondary | ICD-10-CM

## 2022-07-22 DIAGNOSIS — Z122 Encounter for screening for malignant neoplasm of respiratory organs: Secondary | ICD-10-CM

## 2022-07-22 NOTE — Patient Instructions (Signed)
Susan Ward , Thank you for taking time to come for your Medicare Wellness Visit. I appreciate your ongoing commitment to your health goals. Please review the following plan we discussed and let me know if I can assist you in the future.   These are the goals we discussed:  Goals      DIET - INCREASE WATER INTAKE     Try to drink 6-8 glasses of water daily     Exercise 3x per week (30 min per time)     Have 3 meals a day        This is a list of the screening recommended for you and due dates:  Health Maintenance  Topic Date Due   Screening for Lung Cancer  Never done   COVID-19 Vaccine (6 - 2023-24 season) 01/18/2022   Mammogram  02/13/2023*   Colon Cancer Screening  02/13/2023*   Medicare Annual Wellness Visit  07/22/2023   DTaP/Tdap/Td vaccine (2 - Td or Tdap) 01/22/2026   Flu Shot  Completed   Hepatitis C Screening: USPSTF Recommendation to screen - Ages 18-79 yo.  Completed   HIV Screening  Completed   Zoster (Shingles) Vaccine  Completed   HPV Vaccine  Aged Out  *Topic was postponed. The date shown is not the original due date.    Advanced directives: Advance directive discussed with you today. I have provided a copy for you to complete at home and have notarized. Once this is complete please bring a copy in to our office so we can scan it into your chart.   Conditions/risks identified: Aim for 30 minutes of exercise or brisk walking, 6-8 glasses of water, and 5 servings of fruits and vegetables each day.   Next appointment: Follow up in one year for your annual wellness visit.   Preventive Care 40-64 Years, Female Preventive care refers to lifestyle choices and visits with your health care provider that can promote health and wellness. What does preventive care include? A yearly physical exam. This is also called an annual well check. Dental exams once or twice a year. Routine eye exams. Ask your health care provider how often you should have your eyes checked. Personal  lifestyle choices, including: Daily care of your teeth and gums. Regular physical activity. Eating a healthy diet. Avoiding tobacco and drug use. Limiting alcohol use. Practicing safe sex. Taking low-dose aspirin daily starting at age 45. Taking vitamin and mineral supplements as recommended by your health care provider. What happens during an annual well check? The services and screenings done by your health care provider during your annual well check will depend on your age, overall health, lifestyle risk factors, and family history of disease. Counseling  Your health care provider may ask you questions about your: Alcohol use. Tobacco use. Drug use. Emotional well-being. Home and relationship well-being. Sexual activity. Eating habits. Work and work Statistician. Method of birth control. Menstrual cycle. Pregnancy history. Screening  You may have the following tests or measurements: Height, weight, and BMI. Blood pressure. Lipid and cholesterol levels. These may be checked every 5 years, or more frequently if you are over 54 years old. Skin check. Lung cancer screening. You may have this screening every year starting at age 79 if you have a 30-pack-year history of smoking and currently smoke or have quit within the past 15 years. Fecal occult blood test (FOBT) of the stool. You may have this test every year starting at age 69. Flexible sigmoidoscopy or colonoscopy. You may  have a sigmoidoscopy every 5 years or a colonoscopy every 10 years starting at age 76. Hepatitis C blood test. Hepatitis B blood test. Sexually transmitted disease (STD) testing. Diabetes screening. This is done by checking your blood sugar (glucose) after you have not eaten for a while (fasting). You may have this done every 1-3 years. Mammogram. This may be done every 1-2 years. Talk to your health care provider about when you should start having regular mammograms. This may depend on whether you have a  family history of breast cancer. BRCA-related cancer screening. This may be done if you have a family history of breast, ovarian, tubal, or peritoneal cancers. Pelvic exam and Pap test. This may be done every 3 years starting at age 66. Starting at age 67, this may be done every 5 years if you have a Pap test in combination with an HPV test. Bone density scan. This is done to screen for osteoporosis. You may have this scan if you are at high risk for osteoporosis. Discuss your test results, treatment options, and if necessary, the need for more tests with your health care provider. Vaccines  Your health care provider may recommend certain vaccines, such as: Influenza vaccine. This is recommended every year. Tetanus, diphtheria, and acellular pertussis (Tdap, Td) vaccine. You may need a Td booster every 10 years. Zoster vaccine. You may need this after age 74. Pneumococcal 13-valent conjugate (PCV13) vaccine. You may need this if you have certain conditions and were not previously vaccinated. Pneumococcal polysaccharide (PPSV23) vaccine. You may need one or two doses if you smoke cigarettes or if you have certain conditions. Talk to your health care provider about which screenings and vaccines you need and how often you need them. This information is not intended to replace advice given to you by your health care provider. Make sure you discuss any questions you have with your health care provider. Document Released: 06/02/2015 Document Revised: 01/24/2016 Document Reviewed: 03/07/2015 Elsevier Interactive Patient Education  2017 [employer] Prevention in the Home Falls can cause injuries. They can happen to people of all ages. There are many things you can do to make your home safe and to help prevent falls. What can I do on the outside of my home? Regularly fix the edges of walkways and driveways and fix any cracks. Remove anything that might make you trip as you walk through a  door, such as a raised step or threshold. Trim any bushes or trees on the path to your home. Use bright outdoor lighting. Clear any walking paths of anything that might make someone trip, such as rocks or tools. Regularly check to see if handrails are loose or broken. Make sure that both sides of any steps have handrails. Any raised decks and porches should have guardrails on the edges. Have any leaves, snow, or ice cleared regularly. Use sand or salt on walking paths during winter. Clean up any spills in your garage right away. This includes oil or grease spills. What can I do in the bathroom? Use night lights. Install grab bars by the toilet and in the tub and shower. Do not use towel bars as grab bars. Use non-skid mats or decals in the tub or shower. If you need to sit down in the shower, use a plastic, non-slip stool. Keep the floor dry. Clean up any water that spills on the floor as soon as it happens. Remove soap buildup in the tub or shower regularly. Attach  bath mats securely with double-sided non-slip rug tape. Do not have throw rugs and other things on the floor that can make you trip. What can I do in the bedroom? Use night lights. Make sure that you have a light by your bed that is easy to reach. Do not use any sheets or blankets that are too big for your bed. They should not hang down onto the floor. Have a firm chair that has side arms. You can use this for support while you get dressed. Do not have throw rugs and other things on the floor that can make you trip. What can I do in the kitchen? Clean up any spills right away. Avoid walking on wet floors. Keep items that you use a lot in easy-to-reach places. If you need to reach something above you, use a strong step stool that has a grab bar. Keep electrical cords out of the way. Do not use floor polish or wax that makes floors slippery. If you must use wax, use non-skid floor wax. Do not have throw rugs and other things  on the floor that can make you trip. What can I do with my stairs? Do not leave any items on the stairs. Make sure that there are handrails on both sides of the stairs and use them. Fix handrails that are broken or loose. Make sure that handrails are as long as the stairways. Check any carpeting to make sure that it is firmly attached to the stairs. Fix any carpet that is loose or worn. Avoid having throw rugs at the top or bottom of the stairs. If you do have throw rugs, attach them to the floor with carpet tape. Make sure that you have a light switch at the top of the stairs and the bottom of the stairs. If you do not have them, ask someone to add them for you. What else can I do to help prevent falls? Wear shoes that: Do not have high heels. Have rubber bottoms. Are comfortable and fit you well. Are closed at the toe. Do not wear sandals. If you use a stepladder: Make sure that it is fully opened. Do not climb a closed stepladder. Make sure that both sides of the stepladder are locked into place. Ask someone to hold it for you, if possible. Clearly mark and make sure that you can see: Any grab bars or handrails. First and last steps. Where the edge of each step is. Use tools that help you move around (mobility aids) if they are needed. These include: Canes. Walkers. Scooters. Crutches. Turn on the lights when you go into a dark area. Replace any light bulbs as soon as they burn out. Set up your furniture so you have a clear path. Avoid moving your furniture around. If any of your floors are uneven, fix them. If there are any pets around you, be aware of where they are. Review your medicines with your doctor. Some medicines can make you feel dizzy. This can increase your chance of falling. Ask your doctor what other things that you can do to help prevent falls. This information is not intended to replace advice given to you by your health care provider. Make sure you discuss any  questions you have with your health care provider. Document Released: 03/02/2009 Document Revised: 10/12/2015 Document Reviewed: 06/10/2014 Elsevier Interactive Patient Education  2017 [employer].
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
## 2022-07-31 DIAGNOSIS — G894 Chronic pain syndrome: Secondary | ICD-10-CM | POA: Diagnosis not present

## 2022-07-31 DIAGNOSIS — M5416 Radiculopathy, lumbar region: Secondary | ICD-10-CM | POA: Diagnosis not present

## 2022-08-06 ENCOUNTER — Other Ambulatory Visit: Payer: Self-pay | Admitting: Family Medicine

## 2022-08-06 DIAGNOSIS — E7800 Pure hypercholesterolemia, unspecified: Secondary | ICD-10-CM

## 2022-08-28 ENCOUNTER — Other Ambulatory Visit: Payer: Self-pay

## 2022-08-28 ENCOUNTER — Other Ambulatory Visit: Payer: Self-pay | Admitting: Family Medicine

## 2022-08-28 DIAGNOSIS — I10 Essential (primary) hypertension: Secondary | ICD-10-CM

## 2022-08-28 DIAGNOSIS — F17210 Nicotine dependence, cigarettes, uncomplicated: Secondary | ICD-10-CM

## 2022-08-28 DIAGNOSIS — Z87891 Personal history of nicotine dependence: Secondary | ICD-10-CM

## 2022-08-28 DIAGNOSIS — K219 Gastro-esophageal reflux disease without esophagitis: Secondary | ICD-10-CM

## 2022-08-28 NOTE — Telephone Encounter (Signed)
Omeprazole not on patients list

## 2022-09-02 ENCOUNTER — Other Ambulatory Visit: Payer: Self-pay | Admitting: *Deleted

## 2022-09-02 DIAGNOSIS — F411 Generalized anxiety disorder: Secondary | ICD-10-CM

## 2022-09-02 DIAGNOSIS — J449 Chronic obstructive pulmonary disease, unspecified: Secondary | ICD-10-CM

## 2022-09-02 DIAGNOSIS — E8941 Symptomatic postprocedural ovarian failure: Secondary | ICD-10-CM

## 2022-09-02 DIAGNOSIS — E7800 Pure hypercholesterolemia, unspecified: Secondary | ICD-10-CM

## 2022-09-02 DIAGNOSIS — F339 Major depressive disorder, recurrent, unspecified: Secondary | ICD-10-CM

## 2022-09-02 DIAGNOSIS — N3941 Urge incontinence: Secondary | ICD-10-CM

## 2022-09-02 MED ORDER — BUSPIRONE HCL 15 MG PO TABS: 15.0000 mg | ORAL_TABLET | Freq: Two times a day (BID) | ORAL | 0 refills | Status: AC

## 2022-09-02 MED ORDER — OXYBUTYNIN CHLORIDE ER 10 MG PO TB24: 10.0000 mg | ORAL_TABLET | Freq: Every day | ORAL | 0 refills | Status: AC

## 2022-09-02 MED ORDER — ATORVASTATIN CALCIUM 40 MG PO TABS: 40.0000 mg | ORAL_TABLET | Freq: Every day | ORAL | 0 refills | Status: AC

## 2022-09-02 MED ORDER — FLUOXETINE HCL 20 MG PO CAPS: 20.0000 mg | ORAL_CAPSULE | Freq: Every day | ORAL | 0 refills | Status: AC

## 2022-09-02 MED ORDER — TRELEGY ELLIPTA 100-62.5-25 MCG/ACT IN AEPB: 1.0000 | INHALATION_SPRAY | Freq: Every day | RESPIRATORY_TRACT | 1 refills | Status: AC

## 2022-09-02 MED ORDER — ALBUTEROL SULFATE HFA 108 (90 BASE) MCG/ACT IN AERS: INHALATION_SPRAY | RESPIRATORY_TRACT | 0 refills | Status: AC

## 2022-09-10 ENCOUNTER — Encounter: Payer: Self-pay | Admitting: Family Medicine

## 2022-09-12 ENCOUNTER — Ambulatory Visit: Payer: Medicare Other | Admitting: Family Medicine

## 2022-09-19 ENCOUNTER — Other Ambulatory Visit: Payer: Self-pay | Admitting: *Deleted

## 2022-09-19 DIAGNOSIS — I10 Essential (primary) hypertension: Secondary | ICD-10-CM

## 2022-09-19 DIAGNOSIS — K219 Gastro-esophageal reflux disease without esophagitis: Secondary | ICD-10-CM

## 2022-09-19 MED ORDER — OMEPRAZOLE 40 MG PO CPDR
40.0000 mg | DELAYED_RELEASE_CAPSULE | Freq: Every day | ORAL | 0 refills | Status: AC
Start: 2022-09-19 — End: ?

## 2022-09-19 MED ORDER — LISINOPRIL 20 MG PO TABS
20.0000 mg | ORAL_TABLET | Freq: Every day | ORAL | 0 refills | Status: AC
Start: 2022-09-19 — End: ?

## 2022-09-19 NOTE — Telephone Encounter (Signed)
Fax from [facility] order pharmacy Pt has been Dismissed from the practice, must send a normal refill to pharmacy Request for Famotidine, Lisinopril, Omeprazole & Magnesium Sent Lisinopril & Omeprazole for 90-d supply Magnesium is not on med list or under reconcile meds Add Famotine from reconcile meds, please advise on this refill

## 2022-09-21 MED ORDER — FAMOTIDINE 40 MG PO TABS: 40.0000 mg | ORAL_TABLET | Freq: Every day | ORAL | 0 refills | Status: AC

## 2022-09-24 ENCOUNTER — Other Ambulatory Visit: Payer: Self-pay | Admitting: Family Medicine

## 2022-09-24 DIAGNOSIS — J449 Chronic obstructive pulmonary disease, unspecified: Secondary | ICD-10-CM

## 2022-10-16 ENCOUNTER — Encounter: Payer: Medicare Other | Admitting: Acute Care

## 2022-10-16 ENCOUNTER — Telehealth: Payer: Self-pay | Admitting: Acute Care

## 2022-10-16 NOTE — Telephone Encounter (Signed)
Pt was marked as a no show for shared decision visit and CT was cancelled. Will call pt to attempt to reschedule.

## 2022-10-16 NOTE — Telephone Encounter (Signed)
Pt. Was a no show for her scheduled 11 am SDMV. I called 3 times. The phone rang and rang. There was no VM of answering machine .  CT is scheduled for 10/22/2022 at [facility]. We will need to reschedule SDMV and possibly CT. Thanks so much

## 2022-10-22 ENCOUNTER — Ambulatory Visit (HOSPITAL_COMMUNITY): Payer: Medicare Other

## 2022-10-28 DIAGNOSIS — G894 Chronic pain syndrome: Secondary | ICD-10-CM | POA: Diagnosis not present

## 2022-10-28 DIAGNOSIS — M5416 Radiculopathy, lumbar region: Secondary | ICD-10-CM | POA: Diagnosis not present

## 2022-11-05 ENCOUNTER — Other Ambulatory Visit: Payer: Self-pay | Admitting: Family Medicine

## 2022-11-05 DIAGNOSIS — E7800 Pure hypercholesterolemia, unspecified: Secondary | ICD-10-CM

## 2022-11-25 DIAGNOSIS — R5383 Other fatigue: Secondary | ICD-10-CM | POA: Diagnosis not present

## 2022-11-25 DIAGNOSIS — Z79899 Other long term (current) drug therapy: Secondary | ICD-10-CM | POA: Diagnosis not present

## 2022-11-25 DIAGNOSIS — Z0000 Encounter for general adult medical examination without abnormal findings: Secondary | ICD-10-CM | POA: Diagnosis not present

## 2022-11-25 DIAGNOSIS — E559 Vitamin D deficiency, unspecified: Secondary | ICD-10-CM | POA: Diagnosis not present

## 2022-11-25 DIAGNOSIS — E7800 Pure hypercholesterolemia, unspecified: Secondary | ICD-10-CM | POA: Diagnosis not present

## 2022-11-25 DIAGNOSIS — M79671 Pain in right foot: Secondary | ICD-10-CM | POA: Diagnosis not present

## 2022-11-25 DIAGNOSIS — Z1159 Encounter for screening for other viral diseases: Secondary | ICD-10-CM | POA: Diagnosis not present

## 2022-11-25 DIAGNOSIS — Z87891 Personal history of nicotine dependence: Secondary | ICD-10-CM | POA: Diagnosis not present

## 2022-11-25 DIAGNOSIS — R030 Elevated blood-pressure reading, without diagnosis of hypertension: Secondary | ICD-10-CM | POA: Diagnosis not present

## 2022-11-25 DIAGNOSIS — H6091 Unspecified otitis externa, right ear: Secondary | ICD-10-CM | POA: Diagnosis not present

## 2022-11-25 DIAGNOSIS — R0602 Shortness of breath: Secondary | ICD-10-CM | POA: Diagnosis not present

## 2022-11-25 DIAGNOSIS — F17210 Nicotine dependence, cigarettes, uncomplicated: Secondary | ICD-10-CM | POA: Diagnosis not present

## 2022-12-03 DIAGNOSIS — M79674 Pain in right toe(s): Secondary | ICD-10-CM | POA: Diagnosis not present

## 2022-12-03 DIAGNOSIS — B078 Other viral warts: Secondary | ICD-10-CM | POA: Diagnosis not present

## 2022-12-03 DIAGNOSIS — M79671 Pain in right foot: Secondary | ICD-10-CM | POA: Diagnosis not present

## 2022-12-05 DIAGNOSIS — L02611 Cutaneous abscess of right foot: Secondary | ICD-10-CM | POA: Diagnosis not present

## 2022-12-05 DIAGNOSIS — M79671 Pain in right foot: Secondary | ICD-10-CM | POA: Diagnosis not present

## 2022-12-09 DIAGNOSIS — F32A Depression, unspecified: Secondary | ICD-10-CM | POA: Diagnosis not present

## 2022-12-09 DIAGNOSIS — J302 Other seasonal allergic rhinitis: Secondary | ICD-10-CM | POA: Diagnosis not present

## 2022-12-09 DIAGNOSIS — R051 Acute cough: Secondary | ICD-10-CM | POA: Diagnosis not present

## 2022-12-09 DIAGNOSIS — J069 Acute upper respiratory infection, unspecified: Secondary | ICD-10-CM | POA: Diagnosis not present

## 2022-12-09 DIAGNOSIS — E7800 Pure hypercholesterolemia, unspecified: Secondary | ICD-10-CM | POA: Diagnosis not present

## 2022-12-09 DIAGNOSIS — K219 Gastro-esophageal reflux disease without esophagitis: Secondary | ICD-10-CM | POA: Diagnosis not present

## 2022-12-09 DIAGNOSIS — I10 Essential (primary) hypertension: Secondary | ICD-10-CM | POA: Diagnosis not present

## 2022-12-09 DIAGNOSIS — F17210 Nicotine dependence, cigarettes, uncomplicated: Secondary | ICD-10-CM | POA: Diagnosis not present

## 2022-12-09 DIAGNOSIS — R7303 Prediabetes: Secondary | ICD-10-CM | POA: Diagnosis not present

## 2022-12-12 DIAGNOSIS — B078 Other viral warts: Secondary | ICD-10-CM | POA: Diagnosis not present

## 2022-12-12 DIAGNOSIS — M79671 Pain in right foot: Secondary | ICD-10-CM | POA: Diagnosis not present

## 2023-01-27 DIAGNOSIS — G894 Chronic pain syndrome: Secondary | ICD-10-CM | POA: Diagnosis not present

## 2023-01-27 DIAGNOSIS — M5416 Radiculopathy, lumbar region: Secondary | ICD-10-CM | POA: Diagnosis not present

## 2023-02-10 ENCOUNTER — Other Ambulatory Visit: Payer: Self-pay | Admitting: Family Medicine

## 2023-02-10 DIAGNOSIS — F411 Generalized anxiety disorder: Secondary | ICD-10-CM

## 2023-03-18 ENCOUNTER — Other Ambulatory Visit: Payer: Self-pay | Admitting: Family Medicine

## 2023-03-18 DIAGNOSIS — E7800 Pure hypercholesterolemia, unspecified: Secondary | ICD-10-CM

## 2023-03-25 DIAGNOSIS — K219 Gastro-esophageal reflux disease without esophagitis: Secondary | ICD-10-CM | POA: Diagnosis not present

## 2023-03-25 DIAGNOSIS — J302 Other seasonal allergic rhinitis: Secondary | ICD-10-CM | POA: Diagnosis not present

## 2023-03-25 DIAGNOSIS — Z5320 Procedure and treatment not carried out because of patient's decision for unspecified reasons: Secondary | ICD-10-CM | POA: Diagnosis not present

## 2023-03-25 DIAGNOSIS — R0602 Shortness of breath: Secondary | ICD-10-CM | POA: Diagnosis not present

## 2023-03-25 DIAGNOSIS — Z23 Encounter for immunization: Secondary | ICD-10-CM | POA: Diagnosis not present

## 2023-03-25 DIAGNOSIS — I10 Essential (primary) hypertension: Secondary | ICD-10-CM | POA: Diagnosis not present

## 2023-04-29 DIAGNOSIS — M5416 Radiculopathy, lumbar region: Secondary | ICD-10-CM | POA: Diagnosis not present

## 2023-04-29 DIAGNOSIS — G894 Chronic pain syndrome: Secondary | ICD-10-CM | POA: Diagnosis not present

## 2023-05-30 DIAGNOSIS — E7800 Pure hypercholesterolemia, unspecified: Secondary | ICD-10-CM | POA: Diagnosis not present

## 2023-05-30 DIAGNOSIS — I10 Essential (primary) hypertension: Secondary | ICD-10-CM | POA: Diagnosis not present

## 2023-05-30 DIAGNOSIS — Z0000 Encounter for general adult medical examination without abnormal findings: Secondary | ICD-10-CM | POA: Diagnosis not present

## 2023-05-30 DIAGNOSIS — K219 Gastro-esophageal reflux disease without esophagitis: Secondary | ICD-10-CM | POA: Diagnosis not present

## 2023-05-30 DIAGNOSIS — J302 Other seasonal allergic rhinitis: Secondary | ICD-10-CM | POA: Diagnosis not present

## 2023-05-30 DIAGNOSIS — B369 Superficial mycosis, unspecified: Secondary | ICD-10-CM | POA: Diagnosis not present

## 2023-07-07 DIAGNOSIS — H6592 Unspecified nonsuppurative otitis media, left ear: Secondary | ICD-10-CM | POA: Diagnosis not present

## 2023-07-07 DIAGNOSIS — R051 Acute cough: Secondary | ICD-10-CM | POA: Diagnosis not present

## 2023-07-07 DIAGNOSIS — B9689 Other specified bacterial agents as the cause of diseases classified elsewhere: Secondary | ICD-10-CM | POA: Diagnosis not present

## 2023-07-07 DIAGNOSIS — J208 Acute bronchitis due to other specified organisms: Secondary | ICD-10-CM | POA: Diagnosis not present

## 2023-07-29 DIAGNOSIS — M5416 Radiculopathy, lumbar region: Secondary | ICD-10-CM | POA: Diagnosis not present

## 2023-07-29 DIAGNOSIS — G894 Chronic pain syndrome: Secondary | ICD-10-CM | POA: Diagnosis not present

## 2023-08-05 DIAGNOSIS — E7800 Pure hypercholesterolemia, unspecified: Secondary | ICD-10-CM | POA: Diagnosis not present

## 2023-08-05 DIAGNOSIS — J302 Other seasonal allergic rhinitis: Secondary | ICD-10-CM | POA: Diagnosis not present

## 2023-08-05 DIAGNOSIS — I10 Essential (primary) hypertension: Secondary | ICD-10-CM | POA: Diagnosis not present

## 2023-09-04 ENCOUNTER — Other Ambulatory Visit (HOSPITAL_COMMUNITY): Payer: Self-pay | Admitting: Physician Assistant

## 2023-09-04 DIAGNOSIS — J449 Chronic obstructive pulmonary disease, unspecified: Secondary | ICD-10-CM | POA: Diagnosis not present

## 2023-09-04 DIAGNOSIS — Z1231 Encounter for screening mammogram for malignant neoplasm of breast: Secondary | ICD-10-CM | POA: Diagnosis not present

## 2023-09-04 DIAGNOSIS — F17218 Nicotine dependence, cigarettes, with other nicotine-induced disorders: Secondary | ICD-10-CM

## 2023-09-04 DIAGNOSIS — I10 Essential (primary) hypertension: Secondary | ICD-10-CM | POA: Diagnosis not present

## 2023-09-04 DIAGNOSIS — R232 Flushing: Secondary | ICD-10-CM | POA: Diagnosis not present

## 2023-09-04 DIAGNOSIS — M51379 Other intervertebral disc degeneration, lumbosacral region without mention of lumbar back pain or lower extremity pain: Secondary | ICD-10-CM | POA: Diagnosis not present

## 2023-09-04 DIAGNOSIS — R7303 Prediabetes: Secondary | ICD-10-CM | POA: Diagnosis not present

## 2023-09-04 DIAGNOSIS — K5903 Drug induced constipation: Secondary | ICD-10-CM | POA: Diagnosis not present

## 2023-09-04 DIAGNOSIS — G43009 Migraine without aura, not intractable, without status migrainosus: Secondary | ICD-10-CM | POA: Diagnosis not present

## 2023-09-04 DIAGNOSIS — Z79899 Other long term (current) drug therapy: Secondary | ICD-10-CM | POA: Diagnosis not present

## 2023-09-04 DIAGNOSIS — E7800 Pure hypercholesterolemia, unspecified: Secondary | ICD-10-CM | POA: Diagnosis not present

## 2023-09-04 DIAGNOSIS — Z713 Dietary counseling and surveillance: Secondary | ICD-10-CM | POA: Diagnosis not present

## 2023-09-04 DIAGNOSIS — K219 Gastro-esophageal reflux disease without esophagitis: Secondary | ICD-10-CM | POA: Diagnosis not present

## 2023-09-04 DIAGNOSIS — F17210 Nicotine dependence, cigarettes, uncomplicated: Secondary | ICD-10-CM | POA: Diagnosis not present

## 2023-09-04 DIAGNOSIS — E785 Hyperlipidemia, unspecified: Secondary | ICD-10-CM | POA: Diagnosis not present

## 2023-09-04 DIAGNOSIS — Z131 Encounter for screening for diabetes mellitus: Secondary | ICD-10-CM | POA: Diagnosis not present

## 2024-01-28 ENCOUNTER — Other Ambulatory Visit: Payer: Self-pay

## 2024-01-28 ENCOUNTER — Ambulatory Visit: Attending: Neurosurgery | Admitting: Physical Therapy

## 2024-01-28 DIAGNOSIS — M5459 Other low back pain: Secondary | ICD-10-CM | POA: Insufficient documentation

## 2024-01-28 DIAGNOSIS — M62838 Other muscle spasm: Secondary | ICD-10-CM | POA: Diagnosis present
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
## 2024-02-04 ENCOUNTER — Encounter
